# Patient Record
Sex: Male | Born: 1945
Health system: Southern US, Community
[De-identification: ages and names within clinical notes are randomized; demographics above are authoritative.]

## PROBLEM LIST (undated history)

## (undated) DIAGNOSIS — I10 Essential (primary) hypertension: Secondary | ICD-10-CM

## (undated) DIAGNOSIS — E119 Type 2 diabetes mellitus without complications: Secondary | ICD-10-CM

## (undated) DIAGNOSIS — C801 Malignant (primary) neoplasm, unspecified: Secondary | ICD-10-CM

## (undated) DIAGNOSIS — M549 Dorsalgia, unspecified: Secondary | ICD-10-CM

## (undated) HISTORY — PX: BACK SURGERY: SHX140

## (undated) HISTORY — PX: PROSTATE SURGERY: SHX751

## (undated) HISTORY — DX: Malignant (primary) neoplasm, unspecified: C80.1

---

## 1997-08-29 DIAGNOSIS — C801 Malignant (primary) neoplasm, unspecified: Secondary | ICD-10-CM

## 1997-08-29 HISTORY — DX: Malignant (primary) neoplasm, unspecified: C80.1

## 2015-07-13 ENCOUNTER — Encounter: Payer: Self-pay | Admitting: Sports Medicine

## 2015-07-14 ENCOUNTER — Encounter: Payer: Self-pay | Admitting: Sports Medicine

## 2015-07-14 ENCOUNTER — Ambulatory Visit (INDEPENDENT_AMBULATORY_CARE_PROVIDER_SITE_OTHER): Payer: PPO

## 2015-07-14 ENCOUNTER — Ambulatory Visit (INDEPENDENT_AMBULATORY_CARE_PROVIDER_SITE_OTHER): Payer: PPO | Admitting: Sports Medicine

## 2015-07-14 VITALS — BP 169/91 | HR 72 | Temp 98.3°F | Resp 18 | Wt 198.5 lb

## 2015-07-14 DIAGNOSIS — M1711 Unilateral primary osteoarthritis, right knee: Secondary | ICD-10-CM

## 2015-07-14 DIAGNOSIS — M1712 Unilateral primary osteoarthritis, left knee: Secondary | ICD-10-CM | POA: Diagnosis not present

## 2015-07-14 DIAGNOSIS — M7061 Trochanteric bursitis, right hip: Secondary | ICD-10-CM | POA: Diagnosis not present

## 2015-07-14 DIAGNOSIS — M25551 Pain in right hip: Secondary | ICD-10-CM

## 2015-07-14 MED ORDER — MELOXICAM 15 MG PO TABS: ORAL_TABLET | ORAL | Status: DC

## 2015-07-14 NOTE — Assessment & Plan Note (Signed)
We will start conservatively. Formal physical therapy, meloxicam, return in one month, injection if no better.

## 2015-07-14 NOTE — Assessment & Plan Note (Signed)
X-rays, meloxicam, formal physical therapy, return in one month, injection if no better.

## 2015-07-14 NOTE — Progress Notes (Signed)
  Subjective:    CC hip and knee pain  HPI:  This is a pleasant 69 year old male, he comes in with a several month history of pain he localizes on the lateral aspect of his right hip, moderate, persistent without radiation, he also has pain he localizes occasionally at the medial joint line with clicking and catching. He feels very weak, and he does have some atrophy in the leg. He does have a history of prostate cancer post-radical prostatectomy, without any known evidence of metastatic spread. He denies any back pain, no paresthesias, no bowel or bladder dysfunction.  Past medical history, Surgical history, Family history not pertinant except as noted below, Social history, Allergies, and medications have been entered into the medical record, reviewed, and no changes needed.   Review of Systems: No headache, visual changes, nausea, vomiting, diarrhea, constipation, dizziness, abdominal pain, skin rash, fevers, chills, night sweats, swollen lymph nodes, weight loss, chest pain, body aches, joint swelling, muscle aches, shortness of breath, mood changes, visual or auditory hallucinations.  Objective:    General: Well Developed, well nourished, and in no acute distress.  Neuro: Alert and oriented x3, extra-ocular muscles intact, sensation grossly intact.  HEENT: Normocephalic, atraumatic, pupils equal round reactive to light, neck supple, no masses, no lymphadenopathy, thyroid nonpalpable.  Skin: Warm and dry, no rashes noted.  Cardiac: Regular rate and rhythm, no murmurs rubs or gallops.  Respiratory: Clear to auscultation bilaterally. Not using accessory muscles, speaking in full sentences.  Abdominal: Soft, nontender, nondistended, positive bowel sounds, no masses, no organomegaly.  Right Hip: ROM IR: 60 Deg, ER: 60 Deg, Flexion: 120 Deg, Extension: 100 Deg, Abduction: 45 Deg, Adduction: 45 Deg Strength IR: 5/5, ER: 5/5, Flexion: 5/5, Extension: 5/5, Abduction: 5/5, Adduction: 5/5 Pelvic  alignment unremarkable to inspection and palpation. Standing hip rotation and gait without trendelenburg / unsteadiness. Greater trochanter with severe tenderness to palpation with extremely weak hip abductor's on the right. No tenderness over piriformis. No SI joint tenderness and normal minimal SI movement. Right Knee: Only minimal visible swelling with mild tenderness at the medial joint line. ROM normal in flexion and extension and lower leg rotation. Ligaments with solid consistent endpoints including ACL, PCL, LCL, MCL. Negative Mcmurray's and provocative meniscal tests. Non painful patellar compression. Patellar and quadriceps tendons unremarkable. Hamstring and quadriceps strength is normal.  Impression and Recommendations:    The patient was counselled, risk factors were discussed, anticipatory guidance given.

## 2015-07-27 ENCOUNTER — Encounter: Payer: Self-pay | Admitting: Rehabilitative and Restorative Service Providers"

## 2015-07-27 ENCOUNTER — Ambulatory Visit (INDEPENDENT_AMBULATORY_CARE_PROVIDER_SITE_OTHER): Payer: PPO | Admitting: Rehabilitative and Restorative Service Providers"

## 2015-07-27 DIAGNOSIS — R269 Unspecified abnormalities of gait and mobility: Secondary | ICD-10-CM

## 2015-07-27 DIAGNOSIS — R6889 Other general symptoms and signs: Secondary | ICD-10-CM

## 2015-07-27 DIAGNOSIS — M623 Immobility syndrome (paraplegic): Secondary | ICD-10-CM | POA: Diagnosis not present

## 2015-07-27 DIAGNOSIS — Z7409 Other reduced mobility: Secondary | ICD-10-CM

## 2015-07-27 DIAGNOSIS — M2560 Stiffness of unspecified joint, not elsewhere classified: Secondary | ICD-10-CM

## 2015-07-27 DIAGNOSIS — R531 Weakness: Secondary | ICD-10-CM

## 2015-07-27 DIAGNOSIS — R29898 Other symptoms and signs involving the musculoskeletal system: Secondary | ICD-10-CM

## 2015-07-27 NOTE — Therapy (Signed)
[place], [place], [address] Phone: [phone]   Fax:  [phone]  Physical Therapy Evaluation  Patient Details  Name: Anthony Peterson MRN: [id number] Date of Birth: Jan 05, 1946 Referring Provider: Dr. Dianah Field   Encounter Date: 07/27/2015      PT End of Session - 07/27/15 0928    Visit Number 1   Number of Visits 12   Date for PT Re-Evaluation 09/07/15   PT Start Time 0929   PT Stop Time 1015   PT Time Calculation (min) 46 min   Activity Tolerance Patient tolerated treatment well      Past Medical History  Diagnosis Date  . Cancer [facility]) 1999    Prostate    Past Surgical History  Procedure Laterality Date  . Prostate surgery      There were no vitals filed for this visit.  Visit Diagnosis:  Abnormal gait - Plan: PT plan of care cert/re-cert  Stiffness due to immobility - Plan: PT plan of care cert/re-cert  Weakness of right leg - Plan: PT plan of care cert/re-cert  Decreased strength, endurance, and mobility - Plan: PT plan of care cert/re-cert      Subjective Assessment - 07/27/15 0931    Subjective Patient reports that he has difficulty with Rt hip - some pain and feeling of dragging when he does much walking. He has had symptoms for several years. He had several tests but no treatment. MD has injection scheduled 08/07/15.    Pertinent History Rt hip problems fot ~4 years ago inc weakness and difficulty walking with knee not bending and leg "grabbing" some difficulty going up stairs    How long can you sit comfortably? 2 hours - difficulty moving after having been sitting    How long can you stand comfortably? 1 hour with movement    How long can you walk comfortably? 20-30 min    Diagnostic tests xrays hip and knee             [facility] PT Assessment - 07/27/15 0001    Assessment   Medical Diagnosis trochanteric bursitis Rt hip    Referring Provider Dr. Dianah Field     Onset Date/Surgical Date 09/09/14   Hand Dominance Right   Next MD Visit 08/11/15   Prior Therapy none   Precautions   Precautions None   Balance Screen   Has the patient fallen in the past 6 months No   Has the patient had a decrease in activity level because of a fear of falling?  No   Is the patient reluctant to leave their home because of a fear of falling?  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 - no lifting    Leisure yard work seasonally; TV   Observation/Other Assessments   Focus on Therapeutic Outcomes (FOTO)  60% limitation   Sensation   Additional Comments WFL's per pt report    Posture/Postural Control   Posture Comments som ehead forward posture with increased thoracic kyphosis; decreased lumbar lordosis - weight shifted to Lt with Rt hips and knee slightly bent   AROM   Lumbar Flexion 55%   Lumbar Extension 45%   Lumbar - Right Side Bend 65%   Lumbar - Left  Side Bend 70%   Lumbar - Right Rotation 60%   Lumbar - Left Rotation 65%   Strength   Right Hip Flexion 4/5   Right Hip Extension 4/5   Right Hip ABduction 4+/5   Right Hip ADduction 4+/5   Left Hip Flexion 5/5   Left Hip Extension 5/5   Left Hip ABduction 5/5   Left Hip ADduction 5/5   Right/Left Knee --  5/5 bilat    Right/Left Ankle --  heel lift x10 Lt/Rt inc UE support Rt Lt 4+/5; Rt 4/5    Flexibility   Hamstrings Lt 75 deg ; Rt 61 deg    Quadriceps heel ~ 10 inches from buttock Rt; 8 Lt    ITB tight Rt > Lt   Piriformis tight Rt > Lt    Palpation   Palpation comment min/no tenderness to palpation Rt hip area    Ambulation/Gait   Gait Comments abnormal gait with stiffnes sRt LE; circumducts Rt LE with swing phase; decreased wt bearing phase Rt; shuffling typr gait  without fully lifting Rt LE in swing phase                    [facility] Adult PT Treatment/Exercise - 07/27/15 0001    Knee/Hip Exercises: Stretches   Passive Hamstring Stretch 2 reps;30 seconds   Quad Stretch 2 reps;30 seconds   ITB Stretch 2 reps;30 seconds   Piriformis Stretch 2 reps;30 seconds   Other Knee/Hip Stretches prone press up 2 sec hold x10                 PT Education - 07/27/15 1006    Education provided Yes   Education Details HEP   Person(s) Educated Patient   Methods Explanation;Demonstration;Tactile cues;Verbal cues;Handout   Comprehension Verbalized understanding;Returned demonstration;Verbal cues required;Tactile cues required             PT Long Term Goals - 07/27/15 1333    PT LONG TERM GOAL #1   Title Improve hip/LE mobility by 5-10 deg in hamstring stretch and 2-3 inches quad stretch 09/07/15   Time 6   Period Weeks   Status New   PT LONG TERM GOAL #2   Title Increase strength Rt LE by 1/2 to 1 muscle grade 09/07/15   Time 6   Period Weeks   Status New   PT LONG TERM GOAL #3   Title Improve gait pattern with more equal wt bearing Rt LE and improved swing phase 09/07/15   Time 6   Period Weeks   Status New   PT LONG TERM GOAL #4   Title Improve walking tolerance to 45 min 09/07/15   Time 6   Period Weeks   Status New   PT LONG TERM GOAL #5   Title Imrprove FOTO to </= 44% limitation 09/07/15   Time 6   Period Weeks   Status New               Plan - 07/27/15 1330    Clinical Impression Statement Anthony Peterson presents with Rt hip dysfunction including abnormal gait pattern; limited hip/UE mobility and ROM; Rt LE weakness; intermittent pain. He will benefit from PT to address problems identified.    Pt will benefit from skilled therapeutic intervention in order to improve on the following deficits Postural dysfunction;Abnormal gait;Decreased range of motion;Decreased mobility;Decreased strength;Decreased endurance;Decreased activity  tolerance;Pain   Rehab Potential Good   PT Frequency 2x / week   PT Duration 6  weeks   PT Treatment/Interventions Patient/family education;ADLs/Self Care Home Management;Neuromuscular re-education;Manual techniques;Dry needling;Therapeutic exercise;Therapeutic activities;Moist Heat;Electrical Stimulation;Cryotherapy;Ultrasound   PT Next Visit Plan progress with stretching and add strengthening exercises; gait training   PT Home Exercise Plan HEP   Consulted and Agree with Plan of Care Patient         Problem List Patient Active Problem List   Diagnosis Date Noted  . Trochanteric bursitis of right hip 07/14/2015  . Primary osteoarthritis of right knee 07/14/2015    Terralyn Matsumura Nilda Simmer PT, MPH 07/27/2015, 1:40 PM  [facility] [place], [place], [address] Phone: [phone]   Fax:  [phone]  Name: Anthony Peterson MRN: [id number] Date of Birth: 1945-10-23

## 2015-07-27 NOTE — Patient Instructions (Signed)
Trunk: Prone Extension (Press-Ups)    Lie on stomach on firm, flat surface. Relax bottom and legs. Raise chest in air with elbows straight. Keep hips flat on surface, sag stomach. Hold __2__ seconds. Repeat _10___ times. Do __2__ sessions per day. CAUTION: Movement should be gentle and slow.    Quads / HF, Prone    Lie face down, knees together. Grasp one ankle with same-side hand. Use belt  if needed to reach. Gently pull foot toward buttock. Hold _30__ seconds. Repeat _3__ times per session. Do __2-3_ sessions per day.  HIP: Hamstrings - Supine    Place strap around foot. Raise leg up, keep knee straight. Hold __30_ seconds. __3_ reps per set 2-3 per day   Outer Hip Stretch: Reclined IT Band Stretch (Strap)    Strap around opposite foot, pull across only as far as possible with shoulders on mat. Hold for __30 sec. Repeat __3__ times each leg. 2 times/day   Knee to Chest (Flexion)    Pull knee toward chest. Feel stretch in lower back or buttock area. Breathing deeply, Hold _39___ seconds. Repeat with other knee. Repeat __3__ times. Do _2___ sessions per day.   Piriformis Stretch    Lying on back, pull right knee toward opposite shoulder. Hold _30___ seconds. Repeat __3__ times. Do __2-3__ sessions per day.

## 2015-07-30 ENCOUNTER — Encounter: Payer: Self-pay | Admitting: Rehabilitative and Restorative Service Providers"

## 2015-07-30 ENCOUNTER — Ambulatory Visit (INDEPENDENT_AMBULATORY_CARE_PROVIDER_SITE_OTHER): Payer: PPO | Admitting: Rehabilitative and Restorative Service Providers"

## 2015-07-30 DIAGNOSIS — M623 Immobility syndrome (paraplegic): Secondary | ICD-10-CM

## 2015-07-30 DIAGNOSIS — R269 Unspecified abnormalities of gait and mobility: Secondary | ICD-10-CM

## 2015-07-30 DIAGNOSIS — M2560 Stiffness of unspecified joint, not elsewhere classified: Secondary | ICD-10-CM

## 2015-07-30 DIAGNOSIS — R531 Weakness: Secondary | ICD-10-CM

## 2015-07-30 DIAGNOSIS — R29898 Other symptoms and signs involving the musculoskeletal system: Secondary | ICD-10-CM | POA: Diagnosis not present

## 2015-07-30 DIAGNOSIS — Z7409 Other reduced mobility: Secondary | ICD-10-CM | POA: Diagnosis not present

## 2015-07-30 DIAGNOSIS — R6889 Other general symptoms and signs: Secondary | ICD-10-CM

## 2015-07-30 NOTE — Patient Instructions (Addendum)
Bridging    Slowly raise buttocks from floor, keeping stomach tight. Hold 10 sec  Repeat __10__ times per set.  Do _2-3___ sessions per day.   Strengthening: Wall Slide    Leaning on wall, slowly lower buttocks until thighs are parallel to floor. Hold _10___ seconds. Tighten thigh muscles and return. Repeat __10__ times per set.  Do _2___ sessions per day.   Double Knee to Chest (Flexion)    Gently pull both knees toward chest. Feel stretch in lower back or buttock area. Breathing deeply, Hold __30__ seconds. Repeat _3___ times. Do __2__ sessions per day.    Lumbar Rotation: Caudal - Bilateral (Supine)    Feet and knees together, arms outstretched, rotate knees left, turning head in opposite direction, until stretch is felt. Hold __5-10__ seconds. Relax. Repeat __5__ times per set. Do _2___ sessions per day.   Lying on stomach; tighten buttocks; hold 10 sec repeat 10 times 2 x day.

## 2015-07-30 NOTE — Therapy (Signed)
[place], [place], [address] Phone: [phone]   Fax:  [phone]  Physical Therapy Treatment  Patient Details  Name: Anthony Peterson MRN: [id number] Date of Birth: [birth year] Referring Provider: Dr. Dianah Field   Encounter Date: 07/30/2015      PT End of Session - 07/30/15 0851    Visit Number 2   Number of Visits 12   Date for PT Re-Evaluation 09/07/15   PT Start Time 0849   PT Stop Time 0930   PT Time Calculation (min) 41 min   Activity Tolerance Patient tolerated treatment well      Past Medical History  Diagnosis Date  . Cancer [facility]) 1999    Prostate    Past Surgical History  Procedure Laterality Date  . Prostate surgery      There were no vitals filed for this visit.  Visit Diagnosis:  Abnormal gait  Stiffness due to immobility  Weakness of right leg  Decreased strength, endurance, and mobility      Subjective Assessment - 07/30/15 0852    Subjective Has done his exercises. Does not have pain just stiffness and difficulty walking.    Currently in Pain? No/denies                         [facility] Adult PT Treatment/Exercise - 07/30/15 0001    Knee/Hip Exercises: Stretches   Passive Hamstring Stretch 2 reps;30 seconds   Quad Stretch 2 reps;30 seconds   ITB Stretch 2 reps;30 seconds   Piriformis Stretch 2 reps;30 seconds   Other Knee/Hip Stretches prone press up 2 sec hold x10    Other Knee/Hip Stretches trunk rotation each side x 5    Knee/Hip Exercises: Standing   Wall Squat 5 reps  20 sec hold    Other Standing Knee Exercises working on standing posture and alignment with equal weight Rt LE    Knee/Hip Exercises: Supine   Bridges Limitations 10 sec x 5    Other Supine Knee/Hip Exercises bilat knee to chest 30 sec x 2                 PT Education - 07/30/15 0903    Education provided Yes   Education Details HEP   Person(s) Educated  Patient   Methods Explanation;Demonstration;Tactile cues;Verbal cues;Handout   Comprehension Verbalized understanding;Returned demonstration;Verbal cues required;Tactile cues required             PT Long Term Goals - 07/27/15 1333    PT LONG TERM GOAL #1   Title Improve hip/LE mobility by 5-10 deg in hamstring stretch and 2-3 inches quad stretch 09/07/15   Time 6   Period Weeks   Status New   PT LONG TERM GOAL #2   Title Increase strength Rt LE by 1/2 to 1 muscle grade 09/07/15   Time 6   Period Weeks   Status New   PT LONG TERM GOAL #3   Title Improve gait pattern with more equal wt bearing Rt LE and improved swing phase 09/07/15   Time 6   Period Weeks   Status New   PT LONG TERM GOAL #4   Title Improve walking tolerance to 45 min 09/07/15   Time 6   Period Weeks   Status New   PT LONG TERM GOAL #5   Title Imrprove FOTO to </= 44% limitation 09/07/15   Time 6   Period Weeks  Status New               Plan - 07/30/15 0904    Clinical Impression Statement Tolerated exrercise well. he has significant tightness in bilat LE's Rt > Lt; abnormal gait pattern. No goals accomplished to date. Second visit.    Pt will benefit from skilled therapeutic intervention in order to improve on the following deficits Postural dysfunction;Abnormal gait;Decreased range of motion;Decreased mobility;Decreased strength;Decreased endurance;Decreased activity tolerance;Pain   PT Frequency 2x / week   PT Duration 6 weeks   PT Treatment/Interventions Patient/family education;ADLs/Self Care Home Management;Neuromuscular re-education;Manual techniques;Dry needling;Therapeutic exercise;Therapeutic activities;Moist Heat;Electrical Stimulation;Cryotherapy;Ultrasound   PT Next Visit Plan progress with stretching and add strengthening exercises; gait training   PT Home Exercise Plan HEP   Consulted and Agree with Plan of Care Patient        Problem List Patient Active Problem List   Diagnosis Date  Noted  . Trochanteric bursitis of right hip 07/14/2015  . Primary osteoarthritis of right knee 07/14/2015    Reianna Batdorf Nilda Simmer PT, MPH 07/30/2015, 9:33 AM  [facility] [place], [place], [address] Phone: [phone]   Fax:  [phone]  Name: Anthony Peterson MRN: [id number] Date of Birth: Mar 04, 1946

## 2015-08-03 ENCOUNTER — Ambulatory Visit (INDEPENDENT_AMBULATORY_CARE_PROVIDER_SITE_OTHER): Payer: PPO | Admitting: Rehabilitative and Restorative Service Providers"

## 2015-08-03 ENCOUNTER — Encounter: Payer: Self-pay | Admitting: Rehabilitative and Restorative Service Providers"

## 2015-08-03 DIAGNOSIS — R6889 Other general symptoms and signs: Secondary | ICD-10-CM

## 2015-08-03 DIAGNOSIS — R269 Unspecified abnormalities of gait and mobility: Secondary | ICD-10-CM

## 2015-08-03 DIAGNOSIS — Z7409 Other reduced mobility: Secondary | ICD-10-CM

## 2015-08-03 DIAGNOSIS — R29898 Other symptoms and signs involving the musculoskeletal system: Secondary | ICD-10-CM

## 2015-08-03 DIAGNOSIS — M623 Immobility syndrome (paraplegic): Secondary | ICD-10-CM

## 2015-08-03 DIAGNOSIS — R531 Weakness: Secondary | ICD-10-CM

## 2015-08-03 DIAGNOSIS — M2560 Stiffness of unspecified joint, not elsewhere classified: Secondary | ICD-10-CM

## 2015-08-03 NOTE — Therapy (Addendum)
[place], [place], [address] Phone: [phone]   Fax:  [phone]  Physical Therapy Treatment  Patient Details  Name: Anthony Peterson MRN: [id number] Date of Birth: 21-May-1946 Referring Provider: Dr. Dianah Field   Encounter Date: 08/03/2015      PT End of Session - 08/03/15 0845    Visit Number 3   Number of Visits 12   Date for PT Re-Evaluation 09/07/15   PT Start Time 0845   PT Stop Time 0929   PT Time Calculation (min) 44 min      Past Medical History  Diagnosis Date  . Cancer [facility] 1999    Prostate    Past Surgical History  Procedure Laterality Date  . Prostate surgery      There were no vitals filed for this visit.  Visit Diagnosis:  Abnormal gait  Stiffness due to immobility  Weakness of right leg  Decreased strength, endurance, and mobility      Subjective Assessment - 08/03/15 0847    Subjective pt reports that he is doing his exercises at home. His wofe got him a big belt to use for exercises. he can tell he is loosening up and that helps his walking    Currently in Pain? No/denies                         [facility] Adult PT Treatment/Exercise - 08/03/15 0001    Knee/Hip Exercises: Stretches   Passive Hamstring Stretch 2 reps;30 seconds   Quad Stretch 2 reps;30 seconds   ITB Stretch 2 reps;30 seconds   Piriformis Stretch 2 reps;30 seconds   Other Knee/Hip Stretches prone press up 2 sec hold x10    Other Knee/Hip Stretches trunk rotation each side x 5    Knee/Hip Exercises: Aerobic   Stationary Bike Nustep L4 x5    Knee/Hip Exercises: Standing   Lateral Step Up 20 reps;Hand Hold: 1   Wall Squat 5 reps  20 sec hold    SLS at counter 20-30 sec 0ne hand touch to no assist as needed 3 trials each LE    Other Standing Knee Exercises working on standing posture and alignment with equal weight Rt LE    Other Standing Knee Exercises reaching w/each  hand while in standing    Knee/Hip Exercises: Supine   Bridges Limitations 10 sec x 5    Other Supine Knee/Hip Exercises bilat knee to chest 30 sec x 2    Knee/Hip Exercises: Sidelying   Clams 10 x 2 bilat    Knee/Hip Exercises: Prone   Hip Extension Limitations toe touching table pt lifting knee off surface - alt 20 x ea                 PT Education - 08/03/15 0908    Education provided Yes   Education Details HEP   Person(s) Educated Patient   Methods Explanation;Demonstration;Tactile cues;Verbal cues;Handout   Comprehension Verbalized understanding;Returned demonstration;Verbal cues required;Tactile cues required             PT Long Term Goals - 08/03/15 0852    PT LONG TERM GOAL #1   Title Improve hip/LE mobility by 5-10 deg in hamstring stretch and 2-3 inches quad stretch 09/07/15   Time 6   Period Weeks   Status On-going   PT LONG TERM GOAL #2   Title Increase strength Rt LE by 1/2 to 1 muscle grade 09/07/15  Time 6   Period Weeks   Status On-going   PT LONG TERM GOAL #3   Title Improve gait pattern with more equal wt bearing Rt LE and improved swing phase 09/07/15   Time 6   Period Weeks   Status On-going   PT LONG TERM GOAL #4   Title Improve walking tolerance to 45 min 09/07/15   Time 6   Period Weeks   Status On-going   PT LONG TERM GOAL #5   Title Imrprove FOTO to </= 44% limitation 09/07/15   Time 6   Period Weeks   Status On-going               Plan - 08/03/15 0849    Clinical Impression Statement Progressing with flexibility; tolerates exercises well. Cont tightness bilat LE's Rt > Lt; abnormal gait pattern. Gradually progressing toward stated goals.    Pt will benefit from skilled therapeutic intervention in order to improve on the following deficits Postural dysfunction;Abnormal gait;Decreased range of motion;Decreased mobility;Decreased strength;Decreased endurance;Decreased activity tolerance;Pain   Rehab Potential Good   PT Frequency  2x / week   PT Duration 6 weeks   PT Treatment/Interventions Patient/family education;ADLs/Self Care Home Management;Neuromuscular re-education;Manual techniques;Dry needling;Therapeutic exercise;Therapeutic activities;Moist Heat;Electrical Stimulation;Cryotherapy;Ultrasound   PT Next Visit Plan progress with stretching and add strengthening exercises; gait training   PT Home Exercise Plan HEP   Consulted and Agree with Plan of Care Patient        Problem List Patient Active Problem List   Diagnosis Date Noted  . Trochanteric bursitis of right hip 07/14/2015  . Primary osteoarthritis of right knee 07/14/2015    Beaux Wedemeyer Nilda Simmer PT, MPH  08/03/2015, 9:29 AM  [facility] [place], [place], [address] Phone: [phone]   Fax:  [phone]  Name: Anthony Peterson MRN: [id number] Date of Birth: 02/05/1946

## 2015-08-03 NOTE — Patient Instructions (Signed)
Abduction: Clam - Side-Lying    Lie on side with knees bent. Lift top knee, keeping feet together. Keep trunk steady. Slowly lower for 3-5 seconds. __10_ reps per set, __2_ sets per day  FUNCTIONAL MOBILITY: Lateral Step Up    Step up sideways, leading with right leg. Then repeat with the Left leg  _10__ reps per set, _1-2__ sets per day  SINGLE LIMB STANCE    Stance: single leg on floor. Raise leg. Hold __20-30_ seconds. Repeat with other leg. _3__ reps per set, _2-3__ sets per day

## 2015-08-06 ENCOUNTER — Ambulatory Visit (INDEPENDENT_AMBULATORY_CARE_PROVIDER_SITE_OTHER): Payer: PPO | Admitting: Rehabilitative and Restorative Service Providers"

## 2015-08-06 ENCOUNTER — Encounter: Payer: Self-pay | Admitting: Rehabilitative and Restorative Service Providers"

## 2015-08-06 DIAGNOSIS — R269 Unspecified abnormalities of gait and mobility: Secondary | ICD-10-CM

## 2015-08-06 DIAGNOSIS — M623 Immobility syndrome (paraplegic): Secondary | ICD-10-CM

## 2015-08-06 DIAGNOSIS — R531 Weakness: Secondary | ICD-10-CM

## 2015-08-06 DIAGNOSIS — Z7409 Other reduced mobility: Secondary | ICD-10-CM

## 2015-08-06 DIAGNOSIS — R29898 Other symptoms and signs involving the musculoskeletal system: Secondary | ICD-10-CM | POA: Diagnosis not present

## 2015-08-06 DIAGNOSIS — M2560 Stiffness of unspecified joint, not elsewhere classified: Secondary | ICD-10-CM

## 2015-08-06 NOTE — Therapy (Addendum)
[place], [place], [address] Phone: [phone]   Fax:  [phone]  Physical Therapy Treatment  Patient Details  Name: Anthony Peterson MRN: [id number] Date of Birth: Jan 18, 1946 Referring Provider: Dr. Dianah Field   Encounter Date: 08/06/2015      PT End of Session - 08/06/15 0846    Visit Number 4   Number of Visits 12   Date for PT Re-Evaluation 09/07/15   PT Start Time 0846   PT Stop Time 0932   PT Time Calculation (min) 46 min   Activity Tolerance Patient tolerated treatment well      Past Medical History  Diagnosis Date  . Cancer [facility]) 1999    Prostate    Past Surgical History  Procedure Laterality Date  . Prostate surgery      There were no vitals filed for this visit.  Visit Diagnosis:  Abnormal gait  Stiffness due to immobility  Weakness of right leg  Decreased strength, endurance, and mobility      Subjective Assessment - 08/06/15 0908    Subjective Concentrating more on walking.    Currently in Pain? No/denies                         [facility] Adult PT Treatment/Exercise - 08/06/15 0001    Knee/Hip Exercises: Stretches   Passive Hamstring Stretch 2 reps;30 seconds   Quad Stretch 2 reps;30 seconds   ITB Stretch 2 reps;30 seconds   Piriformis Stretch 2 reps;30 seconds   Other Knee/Hip Stretches prone press up 2 sec hold x10    Other Knee/Hip Stretches trunk rotation each side x 5    Knee/Hip Exercises: Aerobic   Stationary Bike Nustep L4 x5    Knee/Hip Exercises: Standing   Lateral Step Up 20 reps;Hand Hold: 1;Right   Wall Squat 10 reps;10 seconds  20 sec hold    SLS at counter 20-30 sec 0ne hand touch to no assist as needed 3 trials each LE    Other Standing Knee Exercises working on standing posture and alignment with equal weight Rt LE    Knee/Hip Exercises: Seated   Sit to Sand 2 sets;10 reps;without UE support  one set with Rt LE  slightly behind Lt to inc wt bearing Rt    Knee/Hip Exercises: Supine   Bridges Limitations 10 sec x 5    Other Supine Knee/Hip Exercises bilat knee to chest 30 sec x 2    Knee/Hip Exercises: Sidelying   Clams 10 x 2 bilat green TB    Knee/Hip Exercises: Prone   Hip Extension Limitations toe touching table pt lifting knee off surface - alt 20 x ea    Straight Leg Raises Right;10 reps;2 sets                PT Education - 08/06/15 0908    Education provided Yes   Education Details HEP issued TB for home    Person(s) Educated Patient   Methods Explanation;Demonstration;Tactile cues;Verbal cues;Handout   Comprehension Verbalized understanding;Returned demonstration;Verbal cues required;Tactile cues required             PT Long Term Goals - 08/03/15 0852    PT LONG TERM GOAL #1   Title Improve hip/LE mobility by 5-10 deg in hamstring stretch and 2-3 inches quad stretch 09/07/15   Time 6   Period Weeks   Status On-going   PT LONG TERM GOAL #2   Title  Increase strength Rt LE by 1/2 to 1 muscle grade 09/07/15   Time 6   Period Weeks   Status On-going   PT LONG TERM GOAL #3   Title Improve gait pattern with more equal wt bearing Rt LE and improved swing phase 09/07/15   Time 6   Period Weeks   Status On-going   PT LONG TERM GOAL #4   Title Improve walking tolerance to 45 min 09/07/15   Time 6   Period Weeks   Status On-going   PT LONG TERM GOAL #5   Title Imrprove FOTO to </= 44% limitation 09/07/15   Time 6   Period Weeks   Status On-going               Plan - 08/06/15 0909    Clinical Impression Statement Cont tightness Rt > Lt. Responding well to treatment. Progressing toward goals of treatment    Pt will benefit from skilled therapeutic intervention in order to improve on the following deficits Postural dysfunction;Abnormal gait;Decreased range of motion;Decreased mobility;Decreased strength;Decreased endurance;Decreased activity tolerance;Pain   Rehab  Potential Good   PT Frequency 2x / week   PT Duration 6 weeks   PT Treatment/Interventions Patient/family education;ADLs/Self Care Home Management;Neuromuscular re-education;Manual techniques;Dry needling;Therapeutic exercise;Therapeutic activities;Moist Heat;Electrical Stimulation;Cryotherapy;Ultrasound   PT Home Exercise Plan HEP   Consulted and Agree with Plan of Care Patient        Problem List Patient Active Problem List   Diagnosis Date Noted  . Trochanteric bursitis of right hip 07/14/2015  . Primary osteoarthritis of right knee 07/14/2015    Angelik Walls Nilda Simmer PT, MPH  08/06/2015, 9:28 AM  [facility] [place], [place], [address] Phone: [phone]   Fax:  [phone]  Name: FAISAL STRADLING MRN: [id number] Date of Birth: April 28, 1946    PHYSICAL THERAPY DISCHARGE SUMMARY  Visits from Start of Care: 4  Current functional level related to goals / functional outcomes: Improving - decided to have injection and did not want to continue PT at this time.   Remaining deficits: Unknown   Education / Equipment: HEP  Plan: Patient agrees to discharge.  Patient goals were partially met. Patient is being discharged due to not returning since the last visit.  ?????     Ganon Demasi P. Helene Kelp PT, MPH 09/04/2015 8:24 AM

## 2015-08-06 NOTE — Patient Instructions (Signed)
Functional Quadriceps: Sit to Stand    Sit on edge of chair, feet flat on floor. Slowly stand upright, extending knees fully.  Sit back to chair slowly without plopping down.  Repeat _10___ times per set. Do _1-2___ sets per session. Do __2-3__ sessions per day.

## 2015-08-11 ENCOUNTER — Ambulatory Visit (INDEPENDENT_AMBULATORY_CARE_PROVIDER_SITE_OTHER): Payer: PPO | Admitting: Sports Medicine

## 2015-08-11 VITALS — BP 156/85 | HR 79 | Temp 97.7°F | Resp 18 | Wt 197.8 lb

## 2015-08-11 DIAGNOSIS — M1711 Unilateral primary osteoarthritis, right knee: Secondary | ICD-10-CM | POA: Diagnosis not present

## 2015-08-11 DIAGNOSIS — M7061 Trochanteric bursitis, right hip: Secondary | ICD-10-CM | POA: Diagnosis not present

## 2015-08-11 NOTE — Assessment & Plan Note (Signed)
Improve but insufficiently so with physical therapy and meloxicam, greater trochanteric bursa injection as above. Return in one month.

## 2015-08-11 NOTE — Assessment & Plan Note (Signed)
Doing well with physical therapy and meloxicam.

## 2015-08-11 NOTE — Progress Notes (Signed)
  Subjective:    CC: Follow-up  HPI: Right knee osteoarthritis: Resolved with physical therapy and meloxicam  Right trochanteric bursitis: Improved significantly but persistent pain despite physical therapy and meloxicam, agreeable to try injection. Pain is moderate, persistent without radiation and localized over the greater trochanter.  Past medical history, Surgical history, Family history not pertinant except as noted below, Social history, Allergies, and medications have been entered into the medical record, reviewed, and no changes needed.   Review of Systems: No fevers, chills, night sweats, weight loss, chest pain, or shortness of breath.   Objective:    General: Well Developed, well nourished, and in no acute distress.  Neuro: Alert and oriented x3, extra-ocular muscles intact, sensation grossly intact.  HEENT: Normocephalic, atraumatic, pupils equal round reactive to light, neck supple, no masses, no lymphadenopathy, thyroid nonpalpable.  Skin: Warm and dry, no rashes. Cardiac: Regular rate and rhythm, no murmurs rubs or gallops, no lower extremity edema.  Respiratory: Clear to auscultation bilaterally. Not using accessory muscles, speaking in full sentences. Right Hip: ROM IR: 60 Deg, ER: 60 Deg, Flexion: 120 Deg, Extension: 100 Deg, Abduction: 45 Deg, Adduction: 45 Deg Strength IR: 5/5, ER: 5/5, Flexion: 5/5, Extension: 5/5, Abduction: 5/5, Adduction: 5/5 Pelvic alignment unremarkable to inspection and palpation. Standing hip rotation and gait without trendelenburg / unsteadiness. Greater trochanter with mild to moderate tenderness to palpation. No tenderness over piriformis. No SI joint tenderness and normal minimal SI movement.  Procedure:  Injection of right greater trochanteric bursa Consent obtained and verified. Time-out conducted. Noted no overlying erythema, induration, or other signs of local infection. Skin prepped in a sterile fashion. Topical analgesic  spray: Ethyl chloride. Completed without difficulty. Meds: Spinal needle advanced to the greater trochanter, contacted bone and 1 mL kenalog 40, 2 mL lidocaine, 2 mL Marcaine injected in a fanlike pattern. Pain immediately improved suggesting accurate placement of the medication. Advised to call if fevers/chills, erythema, induration, drainage, or persistent bleeding.  Impression and Recommendations:

## 2015-09-11 ENCOUNTER — Ambulatory Visit (INDEPENDENT_AMBULATORY_CARE_PROVIDER_SITE_OTHER): Payer: PPO | Admitting: Sports Medicine

## 2015-09-11 VITALS — BP 178/88 | HR 75 | Temp 97.7°F | Resp 18 | Wt 199.1 lb

## 2015-09-11 DIAGNOSIS — M7061 Trochanteric bursitis, right hip: Secondary | ICD-10-CM | POA: Diagnosis not present

## 2015-09-11 DIAGNOSIS — M1711 Unilateral primary osteoarthritis, right knee: Secondary | ICD-10-CM

## 2015-09-11 NOTE — Progress Notes (Signed)
  Subjective:    CC: Follow-up  HPI:  Right trochanteric bursitis: Essentially resolved with rehabilitation exercises and NSAIDs.  Right knee osteoarthritis: Resolved with NSAIDs and rehabilitation exercises. He continues to get better day today and does not desire interventional treatment until the summertime.  Past medical history, Surgical history, Family history not pertinant except as noted below, Social history, Allergies, and medications have been entered into the medical record, reviewed, and no changes needed.   Review of Systems: No fevers, chills, night sweats, weight loss, chest pain, or shortness of breath.   Objective:    General: Well Developed, well nourished, and in no acute distress.  Neuro: Alert and oriented x3, extra-ocular muscles intact, sensation grossly intact.  HEENT: Normocephalic, atraumatic, pupils equal round reactive to light, neck supple, no masses, no lymphadenopathy, thyroid nonpalpable.  Skin: Warm and dry, no rashes. Cardiac: Regular rate and rhythm, no murmurs rubs or gallops, no lower extremity edema.  Respiratory: Clear to auscultation bilaterally. Not using accessory muscles, speaking in full sentences.  Impression and Recommendations:

## 2015-09-11 NOTE — Assessment & Plan Note (Signed)
Nearly resolved with conservative measures.

## 2015-09-11 NOTE — Assessment & Plan Note (Signed)
Resolved

## 2015-11-23 DIAGNOSIS — R202 Paresthesia of skin: Secondary | ICD-10-CM | POA: Diagnosis not present

## 2015-11-23 DIAGNOSIS — R05 Cough: Secondary | ICD-10-CM | POA: Diagnosis not present

## 2015-11-23 DIAGNOSIS — M50321 Other cervical disc degeneration at C4-C5 level: Secondary | ICD-10-CM | POA: Diagnosis not present

## 2015-11-24 DIAGNOSIS — R202 Paresthesia of skin: Secondary | ICD-10-CM | POA: Diagnosis not present

## 2015-11-24 DIAGNOSIS — R05 Cough: Secondary | ICD-10-CM | POA: Diagnosis not present

## 2015-12-09 DIAGNOSIS — M4802 Spinal stenosis, cervical region: Secondary | ICD-10-CM | POA: Diagnosis not present

## 2015-12-09 DIAGNOSIS — M542 Cervicalgia: Secondary | ICD-10-CM | POA: Diagnosis not present

## 2015-12-18 DIAGNOSIS — M4712 Other spondylosis with myelopathy, cervical region: Secondary | ICD-10-CM | POA: Diagnosis not present

## 2015-12-18 DIAGNOSIS — G959 Disease of spinal cord, unspecified: Secondary | ICD-10-CM | POA: Diagnosis not present

## 2015-12-18 DIAGNOSIS — G9520 Unspecified cord compression: Secondary | ICD-10-CM | POA: Diagnosis not present

## 2015-12-18 DIAGNOSIS — M5030 Other cervical disc degeneration, unspecified cervical region: Secondary | ICD-10-CM | POA: Diagnosis not present

## 2015-12-18 DIAGNOSIS — M542 Cervicalgia: Secondary | ICD-10-CM | POA: Diagnosis not present

## 2015-12-25 DIAGNOSIS — I10 Essential (primary) hypertension: Secondary | ICD-10-CM | POA: Diagnosis not present

## 2015-12-25 DIAGNOSIS — E119 Type 2 diabetes mellitus without complications: Secondary | ICD-10-CM | POA: Diagnosis not present

## 2015-12-25 DIAGNOSIS — M5010 Cervical disc disorder with radiculopathy, unspecified cervical region: Secondary | ICD-10-CM | POA: Diagnosis not present

## 2015-12-25 DIAGNOSIS — Z01812 Encounter for preprocedural laboratory examination: Secondary | ICD-10-CM | POA: Diagnosis not present

## 2015-12-30 DIAGNOSIS — E119 Type 2 diabetes mellitus without complications: Secondary | ICD-10-CM | POA: Diagnosis not present

## 2015-12-30 DIAGNOSIS — I10 Essential (primary) hypertension: Secondary | ICD-10-CM | POA: Diagnosis not present

## 2015-12-30 DIAGNOSIS — G9520 Unspecified cord compression: Secondary | ICD-10-CM | POA: Diagnosis not present

## 2015-12-30 DIAGNOSIS — M4712 Other spondylosis with myelopathy, cervical region: Secondary | ICD-10-CM | POA: Diagnosis not present

## 2015-12-30 DIAGNOSIS — M5000 Cervical disc disorder with myelopathy, unspecified cervical region: Secondary | ICD-10-CM | POA: Diagnosis not present

## 2015-12-30 DIAGNOSIS — M4802 Spinal stenosis, cervical region: Secondary | ICD-10-CM | POA: Diagnosis not present

## 2015-12-30 DIAGNOSIS — E785 Hyperlipidemia, unspecified: Secondary | ICD-10-CM | POA: Diagnosis not present

## 2015-12-30 DIAGNOSIS — M4322 Fusion of spine, cervical region: Secondary | ICD-10-CM | POA: Diagnosis not present

## 2015-12-30 DIAGNOSIS — I341 Nonrheumatic mitral (valve) prolapse: Secondary | ICD-10-CM | POA: Diagnosis not present

## 2015-12-31 DIAGNOSIS — M4802 Spinal stenosis, cervical region: Secondary | ICD-10-CM | POA: Diagnosis not present

## 2015-12-31 DIAGNOSIS — M50021 Cervical disc disorder at C4-C5 level with myelopathy: Secondary | ICD-10-CM | POA: Diagnosis not present

## 2015-12-31 DIAGNOSIS — M5136 Other intervertebral disc degeneration, lumbar region: Secondary | ICD-10-CM | POA: Diagnosis not present

## 2016-01-01 DIAGNOSIS — Z87891 Personal history of nicotine dependence: Secondary | ICD-10-CM | POA: Diagnosis not present

## 2016-01-01 DIAGNOSIS — I10 Essential (primary) hypertension: Secondary | ICD-10-CM | POA: Diagnosis not present

## 2016-01-01 DIAGNOSIS — R269 Unspecified abnormalities of gait and mobility: Secondary | ICD-10-CM | POA: Diagnosis not present

## 2016-01-01 DIAGNOSIS — Z4789 Encounter for other orthopedic aftercare: Secondary | ICD-10-CM | POA: Diagnosis not present

## 2016-01-01 DIAGNOSIS — R531 Weakness: Secondary | ICD-10-CM | POA: Diagnosis not present

## 2016-01-01 DIAGNOSIS — E119 Type 2 diabetes mellitus without complications: Secondary | ICD-10-CM | POA: Diagnosis not present

## 2016-01-05 DIAGNOSIS — I10 Essential (primary) hypertension: Secondary | ICD-10-CM | POA: Diagnosis not present

## 2016-01-05 DIAGNOSIS — E785 Hyperlipidemia, unspecified: Secondary | ICD-10-CM | POA: Diagnosis not present

## 2016-01-05 DIAGNOSIS — J300 Vasomotor rhinitis: Secondary | ICD-10-CM | POA: Diagnosis not present

## 2016-01-05 DIAGNOSIS — M4802 Spinal stenosis, cervical region: Secondary | ICD-10-CM | POA: Diagnosis not present

## 2016-01-05 DIAGNOSIS — R202 Paresthesia of skin: Secondary | ICD-10-CM | POA: Diagnosis not present

## 2016-01-06 DIAGNOSIS — I10 Essential (primary) hypertension: Secondary | ICD-10-CM | POA: Diagnosis not present

## 2016-01-06 DIAGNOSIS — E119 Type 2 diabetes mellitus without complications: Secondary | ICD-10-CM | POA: Diagnosis not present

## 2016-01-06 DIAGNOSIS — Z4789 Encounter for other orthopedic aftercare: Secondary | ICD-10-CM | POA: Diagnosis not present

## 2016-01-06 DIAGNOSIS — R531 Weakness: Secondary | ICD-10-CM | POA: Diagnosis not present

## 2016-01-06 DIAGNOSIS — Z87891 Personal history of nicotine dependence: Secondary | ICD-10-CM | POA: Diagnosis not present

## 2016-01-06 DIAGNOSIS — R269 Unspecified abnormalities of gait and mobility: Secondary | ICD-10-CM | POA: Diagnosis not present

## 2016-01-08 DIAGNOSIS — I10 Essential (primary) hypertension: Secondary | ICD-10-CM | POA: Diagnosis not present

## 2016-01-08 DIAGNOSIS — Z4789 Encounter for other orthopedic aftercare: Secondary | ICD-10-CM | POA: Diagnosis not present

## 2016-01-08 DIAGNOSIS — E119 Type 2 diabetes mellitus without complications: Secondary | ICD-10-CM | POA: Diagnosis not present

## 2016-01-08 DIAGNOSIS — Z87891 Personal history of nicotine dependence: Secondary | ICD-10-CM | POA: Diagnosis not present

## 2016-01-08 DIAGNOSIS — R531 Weakness: Secondary | ICD-10-CM | POA: Diagnosis not present

## 2016-01-08 DIAGNOSIS — R269 Unspecified abnormalities of gait and mobility: Secondary | ICD-10-CM | POA: Diagnosis not present

## 2016-01-12 DIAGNOSIS — I10 Essential (primary) hypertension: Secondary | ICD-10-CM | POA: Diagnosis not present

## 2016-01-12 DIAGNOSIS — Z87891 Personal history of nicotine dependence: Secondary | ICD-10-CM | POA: Diagnosis not present

## 2016-01-12 DIAGNOSIS — Z4789 Encounter for other orthopedic aftercare: Secondary | ICD-10-CM | POA: Diagnosis not present

## 2016-01-12 DIAGNOSIS — E119 Type 2 diabetes mellitus without complications: Secondary | ICD-10-CM | POA: Diagnosis not present

## 2016-01-12 DIAGNOSIS — R269 Unspecified abnormalities of gait and mobility: Secondary | ICD-10-CM | POA: Diagnosis not present

## 2016-01-12 DIAGNOSIS — R531 Weakness: Secondary | ICD-10-CM | POA: Diagnosis not present

## 2016-01-14 DIAGNOSIS — E119 Type 2 diabetes mellitus without complications: Secondary | ICD-10-CM | POA: Diagnosis not present

## 2016-01-14 DIAGNOSIS — R269 Unspecified abnormalities of gait and mobility: Secondary | ICD-10-CM | POA: Diagnosis not present

## 2016-01-14 DIAGNOSIS — I10 Essential (primary) hypertension: Secondary | ICD-10-CM | POA: Diagnosis not present

## 2016-01-14 DIAGNOSIS — Z87891 Personal history of nicotine dependence: Secondary | ICD-10-CM | POA: Diagnosis not present

## 2016-01-14 DIAGNOSIS — R531 Weakness: Secondary | ICD-10-CM | POA: Diagnosis not present

## 2016-01-14 DIAGNOSIS — Z4789 Encounter for other orthopedic aftercare: Secondary | ICD-10-CM | POA: Diagnosis not present

## 2016-01-19 DIAGNOSIS — R531 Weakness: Secondary | ICD-10-CM | POA: Diagnosis not present

## 2016-01-19 DIAGNOSIS — R269 Unspecified abnormalities of gait and mobility: Secondary | ICD-10-CM | POA: Diagnosis not present

## 2016-01-19 DIAGNOSIS — E119 Type 2 diabetes mellitus without complications: Secondary | ICD-10-CM | POA: Diagnosis not present

## 2016-01-19 DIAGNOSIS — I10 Essential (primary) hypertension: Secondary | ICD-10-CM | POA: Diagnosis not present

## 2016-01-19 DIAGNOSIS — Z87891 Personal history of nicotine dependence: Secondary | ICD-10-CM | POA: Diagnosis not present

## 2016-01-19 DIAGNOSIS — Z4789 Encounter for other orthopedic aftercare: Secondary | ICD-10-CM | POA: Diagnosis not present

## 2016-01-21 DIAGNOSIS — I10 Essential (primary) hypertension: Secondary | ICD-10-CM | POA: Diagnosis not present

## 2016-01-21 DIAGNOSIS — Z4789 Encounter for other orthopedic aftercare: Secondary | ICD-10-CM | POA: Diagnosis not present

## 2016-01-21 DIAGNOSIS — E119 Type 2 diabetes mellitus without complications: Secondary | ICD-10-CM | POA: Diagnosis not present

## 2016-01-21 DIAGNOSIS — Z87891 Personal history of nicotine dependence: Secondary | ICD-10-CM | POA: Diagnosis not present

## 2016-01-21 DIAGNOSIS — R531 Weakness: Secondary | ICD-10-CM | POA: Diagnosis not present

## 2016-01-21 DIAGNOSIS — R269 Unspecified abnormalities of gait and mobility: Secondary | ICD-10-CM | POA: Diagnosis not present

## 2016-01-26 DIAGNOSIS — M50322 Other cervical disc degeneration at C5-C6 level: Secondary | ICD-10-CM | POA: Diagnosis not present

## 2016-01-26 DIAGNOSIS — M5031 Other cervical disc degeneration,  high cervical region: Secondary | ICD-10-CM | POA: Diagnosis not present

## 2016-01-26 DIAGNOSIS — Z981 Arthrodesis status: Secondary | ICD-10-CM | POA: Diagnosis not present

## 2016-01-27 DIAGNOSIS — R2681 Unsteadiness on feet: Secondary | ICD-10-CM | POA: Diagnosis not present

## 2016-01-27 DIAGNOSIS — G9520 Unspecified cord compression: Secondary | ICD-10-CM | POA: Diagnosis not present

## 2016-01-29 DIAGNOSIS — G9520 Unspecified cord compression: Secondary | ICD-10-CM | POA: Diagnosis not present

## 2016-02-19 DIAGNOSIS — N5231 Erectile dysfunction following radical prostatectomy: Secondary | ICD-10-CM | POA: Diagnosis not present

## 2016-02-19 DIAGNOSIS — R109 Unspecified abdominal pain: Secondary | ICD-10-CM | POA: Diagnosis not present

## 2016-02-19 DIAGNOSIS — Z8546 Personal history of malignant neoplasm of prostate: Secondary | ICD-10-CM | POA: Diagnosis not present

## 2016-02-26 DIAGNOSIS — J181 Lobar pneumonia, unspecified organism: Secondary | ICD-10-CM | POA: Diagnosis not present

## 2016-02-26 DIAGNOSIS — Z8546 Personal history of malignant neoplasm of prostate: Secondary | ICD-10-CM | POA: Diagnosis not present

## 2016-02-26 DIAGNOSIS — R109 Unspecified abdominal pain: Secondary | ICD-10-CM | POA: Diagnosis not present

## 2016-02-26 DIAGNOSIS — R0989 Other specified symptoms and signs involving the circulatory and respiratory systems: Secondary | ICD-10-CM | POA: Diagnosis not present

## 2016-02-26 DIAGNOSIS — D179 Benign lipomatous neoplasm, unspecified: Secondary | ICD-10-CM | POA: Diagnosis not present

## 2016-02-26 DIAGNOSIS — D171 Benign lipomatous neoplasm of skin and subcutaneous tissue of trunk: Secondary | ICD-10-CM | POA: Diagnosis not present

## 2016-03-02 DIAGNOSIS — G9520 Unspecified cord compression: Secondary | ICD-10-CM | POA: Diagnosis not present

## 2016-03-02 DIAGNOSIS — M436 Torticollis: Secondary | ICD-10-CM | POA: Diagnosis not present

## 2016-03-02 DIAGNOSIS — R2681 Unsteadiness on feet: Secondary | ICD-10-CM | POA: Diagnosis not present

## 2016-03-07 DIAGNOSIS — G9520 Unspecified cord compression: Secondary | ICD-10-CM | POA: Diagnosis not present

## 2016-03-07 DIAGNOSIS — Z981 Arthrodesis status: Secondary | ICD-10-CM | POA: Diagnosis not present

## 2016-03-07 DIAGNOSIS — M4322 Fusion of spine, cervical region: Secondary | ICD-10-CM | POA: Diagnosis not present

## 2016-03-24 DIAGNOSIS — I10 Essential (primary) hypertension: Secondary | ICD-10-CM | POA: Diagnosis not present

## 2016-03-24 DIAGNOSIS — J189 Pneumonia, unspecified organism: Secondary | ICD-10-CM | POA: Diagnosis not present

## 2016-03-24 DIAGNOSIS — E119 Type 2 diabetes mellitus without complications: Secondary | ICD-10-CM | POA: Diagnosis not present

## 2016-03-24 DIAGNOSIS — E785 Hyperlipidemia, unspecified: Secondary | ICD-10-CM | POA: Diagnosis not present

## 2016-03-24 DIAGNOSIS — R05 Cough: Secondary | ICD-10-CM | POA: Diagnosis not present

## 2016-03-24 DIAGNOSIS — J690 Pneumonitis due to inhalation of food and vomit: Secondary | ICD-10-CM | POA: Diagnosis not present

## 2016-03-29 DIAGNOSIS — G9520 Unspecified cord compression: Secondary | ICD-10-CM | POA: Diagnosis not present

## 2016-03-31 DIAGNOSIS — G9520 Unspecified cord compression: Secondary | ICD-10-CM | POA: Diagnosis not present

## 2016-03-31 DIAGNOSIS — R2681 Unsteadiness on feet: Secondary | ICD-10-CM | POA: Diagnosis not present

## 2016-09-09 DIAGNOSIS — E119 Type 2 diabetes mellitus without complications: Secondary | ICD-10-CM | POA: Diagnosis not present

## 2016-09-09 DIAGNOSIS — E785 Hyperlipidemia, unspecified: Secondary | ICD-10-CM | POA: Diagnosis not present

## 2016-09-09 DIAGNOSIS — I10 Essential (primary) hypertension: Secondary | ICD-10-CM | POA: Diagnosis not present

## 2017-03-27 ENCOUNTER — Ambulatory Visit (INDEPENDENT_AMBULATORY_CARE_PROVIDER_SITE_OTHER): Payer: PPO | Admitting: Sports Medicine

## 2017-03-27 DIAGNOSIS — M7061 Trochanteric bursitis, right hip: Secondary | ICD-10-CM

## 2017-03-27 NOTE — Progress Notes (Signed)
  Subjective:    CC: hip pain  HPI: Patient reports ongoing hip pain for many years. Patient describes the pain as dull and achy that comes and goes. It is 7/10 in severity at its worst. He localizes the pain over the right greater trochanter. Patient reports increased activity recently with subsequent worsening of his pain. Patient denies any other inciting events.  Patient endorses occasional difficulty with climbing stairs.    Past medical history:  Negative.  See flowsheet/record as well for more information.  Surgical history: Negative.  See flowsheet/record as well for more information.  Family history: Negative.  See flowsheet/record as well for more information.  Social history: Negative.  See flowsheet/record as well for more information.  Allergies, and medications have been entered into the medical record, reviewed, and no changes needed.   Review of Systems: No fevers, chills, night sweats, weight loss, chest pain, or shortness of breath.   Objective:    General: Well Developed, well nourished, and in no acute distress.  Neuro: Alert and oriented x3, extra-ocular muscles intact, sensation grossly intact.  HEENT: Normocephalic, atraumatic, pupils equal round reactive to light, neck supple, no masses, no lymphadenopathy, thyroid nonpalpable.  Skin: Warm and dry, no rashes. Cardiac: Regular rate and rhythm, no murmurs rubs or gallops, no lower extremity edema.  Respiratory: Clear to auscultation bilaterally. Not using accessory muscles, speaking in full sentences. MSK: Right hip: No gross deformity or swelling on inspection Tender to palpation at greater trochanter, hip extension reproduces pain at area of greater trochanter Range of motion is full Strength is 4/5 on leg flexion and extension, there is marked weakness with hip abduction   Impression and Recommendations:    71 year-old male with hip pain. Patient has a history of greater trochanteric bursitis with a similar  presentation. This is likely a recurrence. Patient received a steroid injection today in clinic. The importance of following-up with physical therapy was discussed with the patient. Patient will continue to monitor for improvement in pain and return to clinic if symptoms do not improve or worsen.

## 2017-03-27 NOTE — Assessment & Plan Note (Signed)
Recurrence of pain, injection as above, formal physical therapy.  Return in one month. Hip abductor's were extremely weak on the right and moderately weak on the left.

## 2017-04-11 ENCOUNTER — Ambulatory Visit: Payer: PPO | Attending: Sports Medicine | Admitting: Physical Therapy

## 2017-04-11 DIAGNOSIS — R262 Difficulty in walking, not elsewhere classified: Secondary | ICD-10-CM | POA: Diagnosis present

## 2017-04-11 DIAGNOSIS — M6281 Muscle weakness (generalized): Secondary | ICD-10-CM

## 2017-04-11 DIAGNOSIS — M25551 Pain in right hip: Secondary | ICD-10-CM | POA: Diagnosis present

## 2017-04-11 NOTE — Patient Instructions (Signed)
Hamstring Step 2   Left foot relaxed, knee straight, other leg bent, foot flat. Raise straight leg further upward to maximal range. Hold _30__ seconds. Relax leg completely down. Repeat __3_ times.  Bridging   Slowly raise buttocks from floor, keeping stomach tight. Repeat _15___ times per set. Do __2__ sets per session.   HIP: Abduction - Side-Lying   Lie on side, legs straight and in line with trunk. Squeeze glutes. Raise top leg up and slightly back. Point toes forward. _15__ reps per set, __2_ sets per day. Bend bottom leg to stabilize pelvis.  Strengthening: Straight Leg Raise (Phase 1)   Tighten muscles on front of right thigh, then lift leg _8-12___ inches from surface, keeping knee locked.  Repeat __15__ times per set. Do _2___ sets per session.

## 2017-04-11 NOTE — Therapy (Signed)
[facility] [address]  [place], [place], [address] Phone: [phone]   Fax:  [phone]  Physical Therapy Evaluation  Patient Details  Name: Anthony Peterson MRN: [id number] Date of Birth: 1946/01/06 Referring Provider: Dr. Dianah Field  Encounter Date: 04/11/2017      PT End of Session - 04/11/17 1038    Visit Number 1   Number of Visits 8   Date for PT Re-Evaluation 05/09/17   Authorization Type [employer]   PT Start Time 1013   PT Stop Time 1044   PT Time Calculation (min) 31 min   Activity Tolerance Patient tolerated treatment well   Behavior During Therapy [facility] for tasks assessed/performed      Past Medical History:  Diagnosis Date  . Cancer [facility]) 1999   Prostate    Past Surgical History:  Procedure Laterality Date  . PROSTATE SURGERY      There were no vitals filed for this visit.       Subjective Assessment - 04/11/17 1014    Subjective Patient reporting R hip pain - had an injection by Dr. Darene Lamer - feeling better. Has some difficulty walking - residual R leg weakness - "years" with no known reason. Denies N&T into leg/feet.    Pertinent History DM, spinal surgery   Patient Stated Goals improve strength of R hip/leg   Currently in Pain? No/denies            [facility] PT Assessment - 04/11/17 1017      Assessment   Medical Diagnosis Trochanteric bursitis of R hip   Referring Provider Dr. Dianah Field   Next MD Visit --  ~4 weeks   Prior Therapy yes - years ago     Precautions   Precautions None     Restrictions   Weight Bearing Restrictions No     Balance Screen   Has the patient fallen in the past 6 months No   Has the patient had a decrease in activity level because of a fear of falling?  No   Is the patient reluctant to leave their home because of a fear of falling?  No     Home Environment   Living Environment Private residence   Living Arrangements Spouse/significant  other   Type of [place] Access Level entry   [place] One level   Additional Comments walking stick - "when I feel like it"     Prior Function   Level of Independence Independent   Vocation Retired     Associate Professor   Overall Cognitive Status Within Functional Limits for tasks assessed     Observation/Other Assessments   Focus on Therapeutic Outcomes (FOTO)  Hip: 42 (58% limited, predicted 44% limited)     Sensation   Light Touch Appears Intact     Coordination   Gross Motor Movements are Fluid and Coordinated Yes     Posture/Postural Control   Posture/Postural Control No significant limitations     ROM / Strength   AROM / PROM / Strength AROM;Strength     AROM   Overall AROM  Within functional limits for tasks performed   Overall AROM Comments B LE     Strength   Strength Assessment Site Hip;Knee   Right/Left Hip Right;Left   Right Hip Flexion 4/5   Right Hip Extension 3+/5   Right Hip ABduction 3+/5   Left Hip Flexion 5/5   Left Hip Extension 4+/5  Left Hip ABduction 4+/5   Right/Left Knee Right;Left   Right Knee Flexion 4-/5   Right Knee Extension 4+/5   Left Knee Flexion 5/5   Left Knee Extension 5/5     Flexibility   Soft Tissue Assessment /Muscle Length yes   Hamstrings B: moderate tightness   Quadriceps B: heel approx 12 in from buttock     Palpation   Palpation comment non-tender to R greater trochanter     Ambulation/Gait   Ambulation/Gait Yes   Ambulation Distance (Feet) 100 Feet   Assistive device None   Gait Pattern Decreased hip/knee flexion - right;Decreased dorsiflexion - right;Decreased weight shift to right;Ataxic;Poor foot clearance - right   Ambulation Surface Level;Indoor            Objective measurements completed on examination: See above findings.          [facility] Adult PT Treatment/Exercise - 04/11/17 1017      Exercises   Exercises Knee/Hip     Knee/Hip Exercises: Stretches   Passive Hamstring Stretch  Right;Left;3 reps;30 seconds   Passive Hamstring Stretch Limitations supine with strap     Knee/Hip Exercises: Supine   Bridges Both;15 reps   Straight Leg Raises Right;Left;10 reps     Knee/Hip Exercises: Sidelying   Hip ABduction Right;10 reps   Hip ABduction Limitations heavy TC at buttock to reduce posterior roll                PT Education - 04/11/17 1037    Education provided Yes   Education Details exam findings, POC, HEP   Person(s) Educated Patient   Methods Explanation;Demonstration;Handout   Comprehension Verbalized understanding;Returned demonstration;Need further instruction             PT Long Term Goals - 04/11/17 1112      PT LONG TERM GOAL #1   Title patient to be independent with advanced HEP    Time 4   Period Weeks   Status New   Target Date 05/09/17     PT LONG TERM GOAL #2   Title patient to improve R hip strength by 1 muscle grade   Time 4   Period Weeks   Status New   Target Date 05/09/17     PT LONG TERM GOAL #3   Title Patient to improve HS flexibility to approx 80 degrees, and quad flexibility to ~5 inch from buttock   Time 4   Period Weeks   Status New   Target Date 05/09/17     PT LONG TERM GOAL #4   Title Patient to demosntrate good heel toe gait pattern with improve weight shift as well as hip/knee flexion during swing phase of gait.    Time 4   Period Weeks   Status New   Target Date 05/09/17     PT LONG TERM GOAL #5   Title Patient to report 0/10 hip pain for > 2 weeks   Time 4   Period Weeks   Status New   Target Date 05/09/17                Plan - 04/11/17 1106    Clinical Impression Statement [name] is a 71 y/o male presenting to [facility] today regarding priamry complaints of recent R hip pain and general R hip/LE weakness. Patient with a near ataxic gait pattern, of which patient reports he has had no diagnosis related to his gait, but does have a history of spinal cord compression per  chart  review. Patient with reduced pain symptoms at R hip since injection, however, does present with noted weakness of R hip, especially with hip abduction and extension, as compared to uninvolved LE. Patient given initial HEP for gentle hip stretching and strengthening wiht good tolerance. patient to beneift form PT to address functional mobility limitations.    Clinical Presentation Stable   Clinical Presentation due to: stable medical condition - gait likely stable for many years   Clinical Decision Making Low   Rehab Potential Good   PT Frequency 2x / week   PT Duration 4 weeks   PT Treatment/Interventions ADLs/Self Care Home Management;Cryotherapy;Electrical Stimulation;Iontophoresis 4mg /ml Dexamethasone;Moist Heat;Ultrasound;Neuromuscular re-education;Balance training;Therapeutic exercise;Therapeutic activities;Functional mobility training;Stair training;Gait training;Patient/family education;Manual techniques;Passive range of motion;Vasopneumatic Device;Taping;Dry needling   PT Next Visit Plan anterior/posterior hip stretching, hip strengthening   Consulted and Agree with Plan of Care Patient      Patient will benefit from skilled therapeutic intervention in order to improve the following deficits and impairments:  Abnormal gait, Decreased activity tolerance, Decreased mobility, Decreased strength, Difficulty walking, Pain  Visit Diagnosis: Pain in right hip - Plan: PT plan of care cert/re-cert  Muscle weakness (generalized) - Plan: PT plan of care cert/re-cert  Difficulty in walking, not elsewhere classified - Plan: PT plan of care cert/re-cert      G-Codes - 40/97/35 1115    Functional Assessment Tool Used (Outpatient Only) FOTO: 42 (58% limited)   Functional Limitation Mobility: Walking and moving around   Mobility: Walking and Moving Around Current Status (H2992) At least 40 percent but less than 60 percent impaired, limited or restricted   Mobility: Walking and Moving Around Goal  Status (E2683) At least 40 percent but less than 60 percent impaired, limited or restricted       Problem List Patient Active Problem List   Diagnosis Date Noted  . Trochanteric bursitis of right hip 07/14/2015  . Primary osteoarthritis of right knee 07/14/2015     Lanney Gins, PT, DPT 04/11/17 11:18 AM   [facility] [address]  [place], [place], [address] Phone: [phone]   Fax:  [phone]  Name: DIONYSIOS MASSMAN MRN: [id number] Date of Birth: 05/18/1946

## 2017-04-14 ENCOUNTER — Ambulatory Visit: Payer: PPO

## 2017-04-14 DIAGNOSIS — M25551 Pain in right hip: Secondary | ICD-10-CM

## 2017-04-14 DIAGNOSIS — R262 Difficulty in walking, not elsewhere classified: Secondary | ICD-10-CM

## 2017-04-14 DIAGNOSIS — M6281 Muscle weakness (generalized): Secondary | ICD-10-CM

## 2017-04-14 NOTE — Therapy (Signed)
[facility] [address]  [place], [place], [address] Phone: [phone]   Fax:  [phone]  Physical Therapy Treatment  Patient Details  Name: Anthony Peterson MRN: [id number] Date of Birth: [birth year] Referring Provider: Dr. Dianah Field  Encounter Date: 04/14/2017      PT End of Session - 04/14/17 0902    Visit Number 2   Number of Visits 8   Date for PT Re-Evaluation 05/09/17   Authorization Type [employer]   PT Start Time 0845   PT Stop Time 0930   PT Time Calculation (min) 45 min   Activity Tolerance Patient tolerated treatment well   Behavior During Therapy [facility] for tasks assessed/performed      Past Medical History:  Diagnosis Date  . Cancer [facility]) 1999   Prostate    Past Surgical History:  Procedure Laterality Date  . PROSTATE SURGERY      There were no vitals filed for this visit.      Subjective Assessment - 04/14/17 0849    Subjective Notes no issues with HEP.  Doing well today.     Patient Stated Goals improve strength of R hip/leg   Currently in Pain? No/denies   Multiple Pain Sites No                         [facility] Adult PT Treatment/Exercise - 04/14/17 0857      Knee/Hip Exercises: Stretches   Passive Hamstring Stretch Right;2 reps;30 seconds   Passive Hamstring Stretch Limitations supine with strap   Hip Flexor Stretch Right;1 rep;30 seconds   Hip Flexor Stretch Limitations in mod thomas position with strap      Knee/Hip Exercises: Aerobic   Stationary Bike REcumbent bike: lvl 2, 6 min      Knee/Hip Exercises: Supine   Bridges Both;15 reps   Bridges with [employer] 15 reps  with ball    Bridges with Clamshell Both;15 reps  B sustained hip abd/ER with red TB at knees   Straight Leg Raises Right;10 reps;Strengthening   Straight Leg Raises Limitations 1#    Other Supine Knee/Hip Exercises Hooklying alternating hip Abd/ER with red TB at knees x 15 reps  each leg     Knee/Hip Exercises: Sidelying   Hip ABduction Right;10 reps;2 sets   Hip ABduction Limitations heavy TC at buttock to reduce posterior roll   Clams R clam shell without resistance x 10 reps; tactile cues to prevent trunk rotation     Knee/Hip Exercises: Prone   Hip Extension Right;15 reps   Hip Extension Limitations Straight leg                      PT Long Term Goals - 04/14/17 0903      PT LONG TERM GOAL #1   Title patient to be independent with advanced HEP    Time 4   Period Weeks   Status On-going     PT LONG TERM GOAL #2   Title patient to improve R hip strength by 1 muscle grade   Time 4   Period Weeks   Status On-going     PT LONG TERM GOAL #3   Title Patient to improve HS flexibility to approx 80 degrees, and quad flexibility to ~5 inch from buttock   Time 4   Period Weeks   Status On-going     PT LONG TERM GOAL #4  Title Patient to demosntrate good heel toe gait pattern with improve weight shift as well as hip/knee flexion during swing phase of gait.    Time 4   Period Weeks   Status On-going     PT LONG TERM GOAL #5   Title Patient to report 0/10 hip pain for > 2 weeks   Time 4   Period Weeks   Status On-going               Plan - 04/14/17 0904    Clinical Impression Statement Pt. doing well today noting he has had no issued with HEP.  Tolerated hip strengthening activity focused on R-sided strengthening well today.  Constant cueing required with sidelying abduction and clamshell to prevent substitution.  Will continued to progress therex per pt. in coming visits.   PT Treatment/Interventions ADLs/Self Care Home Management;Cryotherapy;Electrical Stimulation;Iontophoresis 4mg /ml Dexamethasone;Moist Heat;Ultrasound;Neuromuscular re-education;Balance training;Therapeutic exercise;Therapeutic activities;Functional mobility training;Stair training;Gait training;Patient/family education;Manual techniques;Passive range of  motion;Vasopneumatic Device;Taping;Dry needling   PT Next Visit Plan anterior/posterior hip stretching, hip strengthening      Patient will benefit from skilled therapeutic intervention in order to improve the following deficits and impairments:  Abnormal gait, Decreased activity tolerance, Decreased mobility, Decreased strength, Difficulty walking, Pain  Visit Diagnosis: Pain in right hip  Muscle weakness (generalized)  Difficulty in walking, not elsewhere classified     Problem List Patient Active Problem List   Diagnosis Date Noted  . Trochanteric bursitis of right hip 07/14/2015  . Primary osteoarthritis of right knee 07/14/2015    Bess Harvest, PTA 04/14/17 11:38 AM   [facility] [address]  [place], [place], [address] Phone: [phone]   Fax:  [phone]  Name: Anthony Peterson MRN: [id number] Date of Birth: Nov 06, 1945

## 2017-04-18 ENCOUNTER — Ambulatory Visit: Payer: PPO | Admitting: Physical Therapy

## 2017-04-18 DIAGNOSIS — M25551 Pain in right hip: Secondary | ICD-10-CM

## 2017-04-18 DIAGNOSIS — R262 Difficulty in walking, not elsewhere classified: Secondary | ICD-10-CM

## 2017-04-18 DIAGNOSIS — M6281 Muscle weakness (generalized): Secondary | ICD-10-CM

## 2017-04-18 NOTE — Therapy (Signed)
[facility] [address]  [place], [place], [address] Phone: [phone]   Fax:  [phone]  Physical Therapy Treatment  Patient Details  Name: Anthony Peterson MRN: [id number] Date of Birth: 10/27/1945 Referring Provider: Dr. Dianah Field  Encounter Date: 04/18/2017      PT End of Session - 04/18/17 1106    Visit Number 3   Number of Visits 8   Date for PT Re-Evaluation 05/09/17   Authorization Type [employer]   PT Start Time 1104   PT Stop Time 1143   PT Time Calculation (min) 39 min   Activity Tolerance Patient tolerated treatment well   Behavior During Therapy [facility] for tasks assessed/performed      Past Medical History:  Diagnosis Date  . Cancer [facility]) 1999   Prostate    Past Surgical History:  Procedure Laterality Date  . PROSTATE SURGERY      There were no vitals filed for this visit.      Subjective Assessment - 04/18/17 1105    Subjective feeling well - no new complaints; had some soreness after last session   Pertinent History DM, spinal surgery   Patient Stated Goals improve strength of R hip/leg   Currently in Pain? No/denies   Multiple Pain Sites No                         [facility] Adult PT Treatment/Exercise - 04/18/17 1107      Knee/Hip Exercises: Aerobic   Recumbent Bike L3 x 6 minutes     Knee/Hip Exercises: Machines for Strengthening   Cybex Knee Extension 20# B con/R ecc x 15   Cybex Knee Flexion 20# B con/R ecc x 15     Knee/Hip Exercises: Standing   Heel Raises Both;15 reps   Functional Squat 15 reps   Functional Squat Limitations UE support   Wall Squat 15 reps;3 seconds   Other Standing Knee Exercises side stepping - 30 feet each direction - red tband   Other Standing Knee Exercises standing hip drop - R LE x 15 reps     Knee/Hip Exercises: Seated   Sit to Sand 15 reps;without UE support  standing on [employer]; focus on eccentric lowering      Knee/Hip Exercises: Supine   Straight Leg Raises Strengthening;Right;15 reps   Straight Leg Raises Limitations 1#     Knee/Hip Exercises: Sidelying   Hip ABduction Strengthening;Right;15 reps   Hip ABduction Limitations 1#                     PT Long Term Goals - 04/14/17 0903      PT LONG TERM GOAL #1   Title patient to be independent with advanced HEP    Time 4   Period Weeks   Status On-going     PT LONG TERM GOAL #2   Title patient to improve R hip strength by 1 muscle grade   Time 4   Period Weeks   Status On-going     PT LONG TERM GOAL #3   Title Patient to improve HS flexibility to approx 80 degrees, and quad flexibility to ~5 inch from buttock   Time 4   Period Weeks   Status On-going     PT LONG TERM GOAL #4   Title Patient to demosntrate good heel toe gait pattern with improve weight shift as well as hip/knee flexion during swing phase  of gait.    Time 4   Period Weeks   Status On-going     PT LONG TERM GOAL #5   Title Patient to report 0/10 hip pain for > 2 weeks   Time 4   Period Weeks   Status On-going               Plan - 04/18/17 1106    Clinical Impression Statement Patient doing well today - noting some muscle soreness from last session that resolved in approx 1 day. PT session today focusing on proximal hip strengthening with expected fatigue noted at end of session. WIll continue to progress towards goals.    PT Treatment/Interventions ADLs/Self Care Home Management;Cryotherapy;Electrical Stimulation;Iontophoresis 4mg /ml Dexamethasone;Moist Heat;Ultrasound;Neuromuscular re-education;Balance training;Therapeutic exercise;Therapeutic activities;Functional mobility training;Stair training;Gait training;Patient/family education;Manual techniques;Passive range of motion;Vasopneumatic Device;Taping;Dry needling   PT Next Visit Plan anterior/posterior hip stretching, hip strengthening   Consulted and Agree with Plan of Care Patient       Patient will benefit from skilled therapeutic intervention in order to improve the following deficits and impairments:  Abnormal gait, Decreased activity tolerance, Decreased mobility, Decreased strength, Difficulty walking, Pain  Visit Diagnosis: Pain in right hip  Muscle weakness (generalized)  Difficulty in walking, not elsewhere classified     Problem List Patient Active Problem List   Diagnosis Date Noted  . Trochanteric bursitis of right hip 07/14/2015  . Primary osteoarthritis of right knee 07/14/2015     Lanney Gins, PT, DPT 04/18/17 11:51 AM   [facility] [address]  [place], [place], [address] Phone: [phone]   Fax:  [phone]  Name: Anthony Peterson MRN: [id number] Date of Birth: [birth year]

## 2017-04-21 ENCOUNTER — Ambulatory Visit: Payer: PPO | Admitting: Physical Therapy

## 2017-04-25 ENCOUNTER — Ambulatory Visit: Payer: PPO

## 2017-04-25 DIAGNOSIS — M25551 Pain in right hip: Secondary | ICD-10-CM

## 2017-04-25 DIAGNOSIS — R262 Difficulty in walking, not elsewhere classified: Secondary | ICD-10-CM

## 2017-04-25 DIAGNOSIS — M6281 Muscle weakness (generalized): Secondary | ICD-10-CM

## 2017-04-25 NOTE — Therapy (Signed)
Anthony Peterson [address]  [place], [place], [address] Phone: [phone]   Fax:  [phone]  Physical Therapy Treatment  Patient Details  Name: Anthony Peterson MRN: [id number] Date of Birth: 1946/07/09 Referring Provider: Dr. Dianah Field  Encounter Date: 04/25/2017      PT End of Session - 04/25/17 1115    Visit Number 4   Number of Visits 8   Date for PT Re-Evaluation 05/09/17   Authorization Type [employer]   PT Start Time 1106   PT Stop Time 1146   PT Time Calculation (min) 40 min   Activity Tolerance Patient tolerated treatment well   Behavior During Therapy [facility] for tasks assessed/performed      Past Medical History:  Diagnosis Date  . Cancer [facility]) 1999   Prostate    Past Surgical History:  Procedure Laterality Date  . PROSTATE SURGERY      There were no vitals filed for this visit.      Subjective Assessment - 04/25/17 1110    Subjective Pt. doing well able to mow yard without hip pain over weekend.     Patient Stated Goals improve strength of R hip/leg   Currently in Pain? No/denies   Multiple Pain Sites No            [facility] PT Assessment - 04/25/17 1118      Assessment   Next MD Visit   9.10.18                     [facility] Adult PT Treatment/Exercise - 04/25/17 1117      Knee/Hip Exercises: Aerobic   Recumbent Bike L3 x 6 minutes     Knee/Hip Exercises: Machines for Strengthening   Cybex Knee Extension 25# B con/R ecc x 15   Cybex Knee Flexion 25# B con/R ecc x 15     Knee/Hip Exercises: Standing   Hip Abduction Right;Left;10 reps;Knee straight   Hip Extension Right;Left;10 reps;Knee straight   Wall Squat 15 reps;3 seconds     Knee/Hip Exercises: Supine   Bridges Both;15 reps   Bridges with Clamshell Both;15 reps  green TB    Straight Leg Raises Strengthening;Right;15 reps   Straight Leg Raises Limitations 1#   Other Supine Knee/Hip Exercises Hooklying  alternating hip Abd/ER with green TB at knees x 15 reps each leg     Knee/Hip Exercises: Sidelying   Hip ABduction Strengthening;Right;15 reps   Hip ABduction Limitations 1#                     PT Long Term Goals - 04/14/17 0903      PT LONG TERM GOAL #1   Title patient to be independent with advanced HEP    Time 4   Period Weeks   Status On-going     PT LONG TERM GOAL #2   Title patient to improve R hip strength by 1 muscle grade   Time 4   Period Weeks   Status On-going     PT LONG TERM GOAL #3   Title Patient to improve HS flexibility to approx 80 degrees, and quad flexibility to ~5 inch from buttock   Time 4   Period Weeks   Status On-going     PT LONG TERM GOAL #4   Title Patient to demosntrate good heel toe gait pattern with improve weight shift as well as hip/knee flexion during swing phase of gait.  Time 4   Period Weeks   Status On-going     PT LONG TERM GOAL #5   Title Patient to report 0/10 hip pain for > 2 weeks   Time 4   Period Weeks   Status On-going               Plan - 04/25/17 1119    Clinical Impression Statement Pt. doing well today noting continued relief of hip pain able to mow yard without issue.  Reports consistent adherence to HEP.  Tolerated mild progression with therex well today focusing on R hip strengthening activities.  Pt. verbalizing LE fatigue following treatment.  Seems to progressing and responding to therex well.      PT Treatment/Interventions ADLs/Self Care Home Management;Cryotherapy;Electrical Stimulation;Iontophoresis 4mg /ml Dexamethasone;Moist Heat;Ultrasound;Neuromuscular re-education;Balance training;Therapeutic exercise;Therapeutic activities;Functional mobility training;Stair training;Gait training;Patient/family education;Manual techniques;Passive range of motion;Vasopneumatic Device;Taping;Dry needling   PT Next Visit Plan anterior/posterior hip stretching, hip strengthening      Patient will  benefit from skilled therapeutic intervention in order to improve the following deficits and impairments:  Abnormal gait, Decreased activity tolerance, Decreased mobility, Decreased strength, Difficulty walking, Pain  Visit Diagnosis: Pain in right hip  Muscle weakness (generalized)  Difficulty in walking, not elsewhere classified     Problem List Patient Active Problem List   Diagnosis Date Noted  . Trochanteric bursitis of right hip 07/14/2015  . Primary osteoarthritis of right knee 07/14/2015    Bess Harvest, PTA 04/25/17 1:23 PM  [facility] [address]  [place], [place], [address] Phone: [phone]   Fax:  [phone]  Name: Anthony Peterson MRN: [id number] Date of Birth: August 04, 1946

## 2017-04-28 ENCOUNTER — Ambulatory Visit: Payer: PPO

## 2017-04-28 DIAGNOSIS — R262 Difficulty in walking, not elsewhere classified: Secondary | ICD-10-CM

## 2017-04-28 DIAGNOSIS — M25551 Pain in right hip: Secondary | ICD-10-CM | POA: Diagnosis not present

## 2017-04-28 DIAGNOSIS — M6281 Muscle weakness (generalized): Secondary | ICD-10-CM

## 2017-04-28 NOTE — Therapy (Signed)
[facility] [address]  [place], [place], [address] Phone: [phone]   Fax:  [phone]  Physical Therapy Treatment  Patient Details  Name: Anthony Peterson MRN: [id number] Date of Birth: [birth year] Referring Provider: Dr. Dianah Field  Encounter Date: 04/28/2017      PT End of Session - 04/28/17 1129    Visit Number 5   Number of Visits 8   Date for PT Re-Evaluation 05/09/17   Authorization Type [employer]   PT Start Time 1111  pt. arrived late    PT Stop Time 1155   PT Time Calculation (min) 44 min   Activity Tolerance Patient tolerated treatment well   Behavior During Therapy [facility] for tasks assessed/performed      Past Medical History:  Diagnosis Date  . Cancer [facility]) 1999   Prostate    Past Surgical History:  Procedure Laterality Date  . PROSTATE SURGERY      There were no vitals filed for this visit.      Subjective Assessment - 04/28/17 1119    Subjective Pt. doing well today no new complaints.     Patient Stated Goals improve strength of R hip/leg   Currently in Pain? No/denies   Multiple Pain Sites No                         [facility] Adult PT Treatment/Exercise - 04/28/17 1124      Knee/Hip Exercises: Aerobic   Recumbent Bike L3 x 6 minutes     Knee/Hip Exercises: Standing   Heel Raises Both;20 reps   Knee Flexion Right;Left;20 reps   Knee Flexion Limitations 1#; at counter    Forward Lunges 10 reps;3 seconds   Hip Abduction Right;Left;10 reps;Knee straight   Abduction Limitations 1#; at counter    Hip Extension Right;Left;10 reps;Knee straight   Extension Limitations 1#; 45 dg kick back   at counter    Functional Squat 15 reps   Functional Squat Limitations at counter;   cues required for LE spacing    Wall Squat --   Wall Squat Limitations --     Knee/Hip Exercises: Seated   Sit to Sand without UE support;10 reps  sit to stand from 15" machine seat      Knee/Hip Exercises: Prone   Hip Extension Right;15 reps   Hip Extension Limitations 1#; Straight leg                 PT Education - 04/28/17 1159    Education provided Yes   Education Details Counter squat, sit<>stand (pt. states he already has these home exercises however not in chart thus issued pictures)   Person(s) Educated Patient   Methods Explanation;Demonstration;Verbal cues;Handout   Comprehension Verbalized understanding;Returned demonstration;Verbal cues required;Need further instruction             PT Long Term Goals - 04/14/17 0903      PT LONG TERM GOAL #1   Title patient to be independent with advanced HEP    Time 4   Period Weeks   Status On-going     PT LONG TERM GOAL #2   Title patient to improve R hip strength by 1 muscle grade   Time 4   Period Weeks   Status On-going     PT LONG TERM GOAL #3   Title Patient to improve HS flexibility to approx 80 degrees, and quad flexibility to ~5 inch from  buttock   Time 4   Period Weeks   Status On-going     PT LONG TERM GOAL #4   Title Patient to demosntrate good heel toe gait pattern with improve weight shift as well as hip/knee flexion during swing phase of gait.    Time 4   Period Weeks   Status On-going     PT LONG TERM GOAL #5   Title Patient to report 0/10 hip pain for > 2 weeks   Time 4   Period Weeks   Status On-going               Plan - 04/28/17 1123    Clinical Impression Statement [name] doing well today noting still pain free.  Functional strengthening progressed today with pt. tolerating well.  Heavy cues required for proper technique with squatting and sit<>stand to prevent Valgus angles at knees.  Pt. seems to be progressing well.   PT Treatment/Interventions ADLs/Self Care Home Management;Cryotherapy;Electrical Stimulation;Iontophoresis 4mg /ml Dexamethasone;Moist Heat;Ultrasound;Neuromuscular re-education;Balance training;Therapeutic exercise;Therapeutic  activities;Functional mobility training;Stair training;Gait training;Patient/family education;Manual techniques;Passive range of motion;Vasopneumatic Device;Taping;Dry needling   PT Next Visit Plan Monitor tolerance to progression of functional strength on 8.31; anterior/posterior hip stretching, hip strengthening      Patient will benefit from skilled therapeutic intervention in order to improve the following deficits and impairments:  Abnormal gait, Decreased activity tolerance, Decreased mobility, Decreased strength, Difficulty walking, Pain  Visit Diagnosis: Pain in right hip  Muscle weakness (generalized)  Difficulty in walking, not elsewhere classified     Problem List Patient Active Problem List   Diagnosis Date Noted  . Trochanteric bursitis of right hip 07/14/2015  . Primary osteoarthritis of right knee 07/14/2015    Bess Harvest, PTA 04/28/17 12:11 PM  [facility] [address]  [place], [place], [address] Phone: [phone]   Fax:  [phone]  Name: Anthony [facility] MRN: [id number] Date of Birth: 05-28-1946

## 2017-05-02 ENCOUNTER — Ambulatory Visit: Payer: PPO | Attending: Family Medicine

## 2017-05-02 DIAGNOSIS — M25551 Pain in right hip: Secondary | ICD-10-CM | POA: Insufficient documentation

## 2017-05-02 DIAGNOSIS — R262 Difficulty in walking, not elsewhere classified: Secondary | ICD-10-CM | POA: Insufficient documentation

## 2017-05-02 DIAGNOSIS — M6281 Muscle weakness (generalized): Secondary | ICD-10-CM

## 2017-05-02 NOTE — Therapy (Addendum)
[facility] [address]  [place], [place], [address] Phone: [phone]   Fax:  [phone]  Physical Therapy Treatment  Patient Details  Name: Anthony Peterson MRN: [id number] Date of Birth: 1945/11/27 Referring Provider: Dr. Dianah Field  Encounter Date: 05/02/2017      PT End of Session - 05/02/17 1122    Visit Number 6   Number of Visits 8   Date for PT Re-Evaluation 05/09/17   Authorization Type [employer]   PT Start Time 1116  pt. arrived late    PT Stop Time 1154   PT Time Calculation (min) 38 min   Activity Tolerance Patient tolerated treatment well   Behavior During Therapy [facility] for tasks assessed/performed      Past Medical History:  Diagnosis Date  . Cancer [facility]) 1999   Prostate    Past Surgical History:  Procedure Laterality Date  . PROSTATE SURGERY      There were no vitals filed for this visit.      Subjective Assessment - 05/02/17 1119    Subjective Pt. doing well today no new complaints.     Currently in Pain? No/denies   Multiple Pain Sites No                         [facility] Adult PT Treatment/Exercise - 05/02/17 1127      Knee/Hip Exercises: Stretches   Passive Hamstring Stretch Right;2 reps;30 seconds   Passive Hamstring Stretch Limitations supine with strap     Knee/Hip Exercises: Aerobic   Recumbent Bike L3 x 6 minutes     Knee/Hip Exercises: Machines for Strengthening   Cybex Knee Extension 25# B con/R ecc x 15   Cybex Knee Flexion 25# B con/R ecc x 15     Knee/Hip Exercises: Standing   Knee Flexion Right;Left;10 reps   Knee Flexion Limitations 2#; at counter    Hip Flexion Right;Left;10 reps;Knee bent   Hip Flexion Limitations 2#; at counter    Hip Abduction Right;Left;10 reps;Knee straight   Abduction Limitations 2#; at counter    Hip Extension Right;Left;10 reps;Knee straight   Extension Limitations 2#; 45 dg kick back    Other Standing Knee  Exercises side stepping - 50 feet each direction - red tband     Knee/Hip Exercises: Seated   Sit to Sand without UE support;15 reps  from 15" machine seat; 2 sets      Knee/Hip Exercises: Sidelying   Hip ABduction 2 sets;Right;20 reps   Hip ABduction Limitations 1#                      PT Long Term Goals - 04/14/17 0903      PT LONG TERM GOAL #1   Title patient to be independent with advanced HEP    Time 4   Period Weeks   Status On-going     PT LONG TERM GOAL #2   Title patient to improve R hip strength by 1 muscle grade   Time 4   Period Weeks   Status On-going     PT LONG TERM GOAL #3   Title Patient to improve HS flexibility to approx 80 degrees, and quad flexibility to ~5 inch from buttock   Time 4   Period Weeks   Status On-going     PT LONG TERM GOAL #4   Title Patient to demosntrate good heel toe gait pattern  with improve weight shift as well as hip/knee flexion during swing phase of gait.    Time 4   Period Weeks   Status On-going     PT LONG TERM GOAL #5   Title Patient to report 0/10 hip pain for > 2 weeks   Time 4   Period Weeks   Status On-going               Plan - 05/02/17 1151    Clinical Impression Statement Pt. arrived significantly late to therapy today thus treatment time limited.  Pt. seems to be responding well to all strengthening therex in treatment and remains pain free with all activities. Treatment focused on functional strengthening with some targeted R hip strengthening today.  Able to progress reps with R hip abduction however still visibly weak in this motion.  Pt. noting improved ability to "work in yard with boots" today.  Seems to be progressing well.      PT Treatment/Interventions ADLs/Self Care Home Management;Cryotherapy;Electrical Stimulation;Iontophoresis 44m/ml Dexamethasone;Moist Heat;Ultrasound;Neuromuscular re-education;Balance training;Therapeutic exercise;Therapeutic activities;Functional mobility  training;Stair training;Gait training;Patient/family education;Manual techniques;Passive range of motion;Vasopneumatic Device;Taping;Dry needling   PT Next Visit Plan Anterior/posterior hip stretching, hip strengthening      Patient will benefit from skilled therapeutic intervention in order to improve the following deficits and impairments:  Abnormal gait, Decreased activity tolerance, Decreased mobility, Decreased strength, Difficulty walking, Pain  Visit Diagnosis: Pain in right hip  Muscle weakness (generalized)  Difficulty in walking, not elsewhere classified     Problem List Patient Active Problem List   Diagnosis Date Noted  . Trochanteric bursitis of right hip 07/14/2015  . Primary osteoarthritis of right knee 07/14/2015    MBess Harvest PTA 05/02/17 12:03 PM   PHYSICAL THERAPY DISCHARGE SUMMARY  Visits from Start of Care: 6  Current functional level related to goals / functional outcomes: See above   Remaining deficits: See above, some gait deficits likely due to spinal cord compression of chronic issue   Education / Equipment: HEP  Plan: Patient agrees to discharge.  Patient goals were not met. Patient is being discharged due to not returning since the last visit.  ?????    Patient with 3 no shows, d/c from PT.  SLanney Gins PT, DPT 07/11/17 2:35 PM    [facility] [address] [place] [address]Phone: [phone]  Fax:  [phone] Name: RASPEN DETERDINGMRN: [id number] of Birth: [birth year]

## 2017-05-05 ENCOUNTER — Ambulatory Visit: Payer: PPO | Admitting: Physical Therapy

## 2017-05-08 ENCOUNTER — Ambulatory Visit (INDEPENDENT_AMBULATORY_CARE_PROVIDER_SITE_OTHER): Payer: PPO | Admitting: Sports Medicine

## 2017-05-08 ENCOUNTER — Encounter: Payer: Self-pay | Admitting: Sports Medicine

## 2017-05-08 VITALS — BP 192/82 | HR 68 | Wt 190.0 lb

## 2017-05-08 DIAGNOSIS — M7061 Trochanteric bursitis, right hip: Secondary | ICD-10-CM | POA: Diagnosis not present

## 2017-05-08 DIAGNOSIS — Z23 Encounter for immunization: Secondary | ICD-10-CM | POA: Diagnosis not present

## 2017-05-08 NOTE — Assessment & Plan Note (Addendum)
Resolved after injection at the last visit. Return as needed. He will follow up with his PCP urgently regarding his blood pressure.

## 2017-05-08 NOTE — Progress Notes (Signed)
  Subjective:    CC: Follow-up  HPI: Right trochanteric bursitis: Pain resolved after injection, strength is improved considerably as well.  They did have some questions about his elevated blood pressure, he is currently establishing with another PCP. No headaches, visual changes, chest pain.  Past medical history:  Negative.  See flowsheet/record as well for more information.  Surgical history: Negative.  See flowsheet/record as well for more information.  Family history: Negative.  See flowsheet/record as well for more information.  Social history: Negative.  See flowsheet/record as well for more information.  Allergies, and medications have been entered into the medical record, reviewed, and no changes needed.   Review of Systems: No fevers, chills, night sweats, weight loss, chest pain, or shortness of breath.   Objective:    General: Well Developed, well nourished, and in no acute distress.  Neuro: Alert and oriented x3, extra-ocular muscles intact, sensation grossly intact.  HEENT: Normocephalic, atraumatic, pupils equal round reactive to light, neck supple, no masses, no lymphadenopathy, thyroid nonpalpable.  Skin: Warm and dry, no rashes. Cardiac: Regular rate and rhythm, no murmurs rubs or gallops, no lower extremity edema.  Respiratory: Clear to auscultation bilaterally. Not using accessory muscles, speaking in full sentences. Right Hip: ROM IR: 60 Deg, ER: 60 Deg, Flexion: 120 Deg, Extension: 100 Deg, Abduction: 45 Deg, Adduction: 45 Deg Strength IR: 5/5, ER: 5/5, Flexion: 5/5, Extension: 5/5, Abduction: 5/5, Adduction: 5/5 Pelvic alignment unremarkable to inspection and palpation. Standing hip rotation and gait without trendelenburg / unsteadiness. Greater trochanter without tenderness to palpation. No tenderness over piriformis. No SI joint tenderness and normal minimal SI movement. Hip abductor strength greatly improved bilaterally.  Impression and Recommendations:     Trochanteric bursitis of right hip Resolved after injection at the last visit. Return as needed. He will follow up with his PCP urgently regarding his blood pressure.  ___________________________________________ Gwen Her. Dianah Field, M.D., ABFM., CAQSM. Primary Care and [place] Instructor of [place] of [facility] of Medicine

## 2017-05-09 ENCOUNTER — Ambulatory Visit: Payer: PPO

## 2017-05-09 DIAGNOSIS — I10 Essential (primary) hypertension: Secondary | ICD-10-CM | POA: Diagnosis not present

## 2017-05-09 DIAGNOSIS — Z23 Encounter for immunization: Secondary | ICD-10-CM | POA: Diagnosis not present

## 2017-05-09 DIAGNOSIS — E1142 Type 2 diabetes mellitus with diabetic polyneuropathy: Secondary | ICD-10-CM | POA: Diagnosis not present

## 2017-05-09 DIAGNOSIS — E785 Hyperlipidemia, unspecified: Secondary | ICD-10-CM | POA: Diagnosis not present

## 2017-05-12 ENCOUNTER — Ambulatory Visit: Payer: PPO

## 2017-05-16 DIAGNOSIS — I10 Essential (primary) hypertension: Secondary | ICD-10-CM | POA: Diagnosis not present

## 2017-05-16 DIAGNOSIS — E1142 Type 2 diabetes mellitus with diabetic polyneuropathy: Secondary | ICD-10-CM | POA: Diagnosis not present

## 2017-05-18 DIAGNOSIS — E876 Hypokalemia: Secondary | ICD-10-CM | POA: Diagnosis not present

## 2017-06-08 DIAGNOSIS — Z0000 Encounter for general adult medical examination without abnormal findings: Secondary | ICD-10-CM | POA: Diagnosis not present

## 2017-06-08 DIAGNOSIS — Z13820 Encounter for screening for osteoporosis: Secondary | ICD-10-CM | POA: Diagnosis not present

## 2017-06-08 DIAGNOSIS — Z23 Encounter for immunization: Secondary | ICD-10-CM | POA: Diagnosis not present

## 2017-06-08 DIAGNOSIS — E1142 Type 2 diabetes mellitus with diabetic polyneuropathy: Secondary | ICD-10-CM | POA: Diagnosis not present

## 2017-06-08 DIAGNOSIS — I10 Essential (primary) hypertension: Secondary | ICD-10-CM | POA: Diagnosis not present

## 2017-06-08 DIAGNOSIS — H6121 Impacted cerumen, right ear: Secondary | ICD-10-CM | POA: Diagnosis not present

## 2017-06-08 DIAGNOSIS — H9192 Unspecified hearing loss, left ear: Secondary | ICD-10-CM | POA: Diagnosis not present

## 2017-10-10 ENCOUNTER — Ambulatory Visit (INDEPENDENT_AMBULATORY_CARE_PROVIDER_SITE_OTHER): Payer: PPO | Admitting: Sports Medicine

## 2017-10-10 ENCOUNTER — Encounter: Payer: Self-pay | Admitting: Sports Medicine

## 2017-10-10 DIAGNOSIS — Q761 Klippel-Feil syndrome: Secondary | ICD-10-CM

## 2017-10-10 DIAGNOSIS — M7061 Trochanteric bursitis, right hip: Secondary | ICD-10-CM | POA: Diagnosis not present

## 2017-10-10 NOTE — Progress Notes (Signed)
Subjective:    I'm seeing this patient as a consultation for: Dr. Yong Channel  CC: Right hip pain  HPI: This is a pleasant 72 year old male, initially I noted an abnormal gait, on further questioning this gait has been present and stable for decades.  He is however having some pain in his right hip, lateral aspect, moderate, persistent without radiation.  Weakness to right hip abduction, pain when lying on the ipsilateral side.  This is been present for several months.  I reviewed the past medical history, family history, social history, surgical history, and allergies today and no changes were needed.  Please see the problem list section below in epic for further details.  Past Medical History: Past Medical History:  Diagnosis Date  . Cancer [facility]) 1999   Prostate   Past Surgical History: Past Surgical History:  Procedure Laterality Date  . PROSTATE SURGERY     Social History: Social History   Socioeconomic History  . Marital status: Married    Spouse name: Not on file  . Number of children: Not on file  . Years of education: Not on file  . Highest education level: Not on file  Social Needs  . Financial resource strain: Not on file  . Food insecurity - worry: Not on file  . Food insecurity - inability: Not on file  . Transportation needs - medical: Not on file  . Transportation needs - non-medical: Not on file  Occupational History  . Not on file  Tobacco Use  . Smoking status: Former Research scientist (life sciences)  . Smokeless tobacco: Never Used  Substance and Sexual Activity  . Alcohol use: No    Alcohol/week: 0.0 oz  . Drug use: No  . Sexual activity: Not on file  Other Topics Concern  . Not on file  Social History Narrative  . Not on file   Family History: No family history on file. Allergies: Allergies  Allergen Reactions  . Adhesive [Tape] Rash    Latex   Medications: See med rec.  Review of Systems: No headache, visual changes, nausea, vomiting, diarrhea,  constipation, dizziness, abdominal pain, skin rash, fevers, chills, night sweats, weight loss, swollen lymph nodes, body aches, joint swelling, muscle aches, chest pain, shortness of breath, mood changes, visual or auditory hallucinations.   Objective:   General: Well Developed, well nourished, and in no acute distress.  Neuro:  Extra-ocular muscles intact, able to move all 4 extremities, sensation grossly intact.  Deep tendon reflexes tested were normal. Psych: Alert and oriented, mood congruent with affect. ENT:  Ears and nose appear unremarkable.  Hearing grossly normal. Neck: Unremarkable overall appearance, trachea midline.  No visible thyroid enlargement. Eyes: Conjunctivae and lids appear unremarkable.  Pupils equal and round. Skin: Warm and dry, no rashes noted.  Cardiovascular: Pulses palpable, no extremity edema. Right hip: ROM IR: 60 Deg, ER: 60 Deg, Flexion: 120 Deg, Extension: 100 Deg, Abduction: 45 Deg, Adduction: 45 Deg Strength IR: 5/5, ER: 5/5, Flexion: 5/5, Extension: 5/5, Abduction: 4-/5, Adduction: 5/5 Pelvic alignment unremarkable to inspection and palpation. Standing hip rotation and gait without trendelenburg / unsteadiness. Greater trochanter with tenderness to palpation. No tenderness over piriformis. No SI joint tenderness and normal minimal SI movement.  Procedure: Real-time Ultrasound Guided Injection of right greater trochanteric bursa Device: GE Logiq E  Verbal informed consent obtained.  Time-out conducted.  Noted no overlying erythema, induration, or other signs of local infection.  Skin prepped in a sterile fashion.  Local anesthesia: Topical Ethyl chloride.  With sterile technique and under real time ultrasound guidance: Using a 22-gauge spinal needle advanced into the bursa just deep to the hip abductor insertion and injected 1 cc Kenalog 40, 2 cc lidocaine, 2 cc bupivacaine. Completed without difficulty  Pain immediately resolved suggesting accurate  placement of the medication.  Advised to call if fevers/chills, erythema, induration, drainage, or persistent bleeding.  Images permanently stored and available for review in the ultrasound unit.  Impression: Technically successful ultrasound guided injection.  Impression and Recommendations:   This case required medical decision making of moderate complexity.  Trochanteric bursitis of right hip Injection. Aggressive hip abductor rehabilitation. Return in 1 month.  Status post C4-C5 fusion for spinal cord compression Distant C4-C5 fusion for spinal cord compression, it sounds though his gait abnormality is old and chronic as well as unchanged after prostate surgery decades ago. None of his symptoms are new, no further intervention or workup needed.  ___________________________________________ Gwen Her. Dianah Field, M.D., ABFM., CAQSM. Primary Care and [place] Instructor of [place] of [facility] of Medicine

## 2017-10-10 NOTE — Assessment & Plan Note (Signed)
Distant C4-C5 fusion for spinal cord compression, it sounds though his gait abnormality is old and chronic as well as unchanged after prostate surgery decades ago. None of his symptoms are new, no further intervention or workup needed.

## 2017-10-10 NOTE — Assessment & Plan Note (Addendum)
Injection. Aggressive hip abductor rehabilitation. Return in 1 month.

## 2017-11-21 ENCOUNTER — Ambulatory Visit: Payer: PPO | Admitting: Sports Medicine

## 2018-06-11 DIAGNOSIS — E1142 Type 2 diabetes mellitus with diabetic polyneuropathy: Secondary | ICD-10-CM | POA: Diagnosis not present

## 2018-06-11 DIAGNOSIS — Z8546 Personal history of malignant neoplasm of prostate: Secondary | ICD-10-CM | POA: Diagnosis not present

## 2018-06-11 DIAGNOSIS — Z0000 Encounter for general adult medical examination without abnormal findings: Secondary | ICD-10-CM | POA: Diagnosis not present

## 2018-06-11 DIAGNOSIS — Z125 Encounter for screening for malignant neoplasm of prostate: Secondary | ICD-10-CM | POA: Diagnosis not present

## 2018-06-11 DIAGNOSIS — E785 Hyperlipidemia, unspecified: Secondary | ICD-10-CM | POA: Diagnosis not present

## 2018-06-11 DIAGNOSIS — Z23 Encounter for immunization: Secondary | ICD-10-CM | POA: Diagnosis not present

## 2018-06-11 DIAGNOSIS — I10 Essential (primary) hypertension: Secondary | ICD-10-CM | POA: Diagnosis not present

## 2018-09-26 ENCOUNTER — Ambulatory Visit: Payer: PPO | Admitting: Sports Medicine

## 2018-09-26 ENCOUNTER — Ambulatory Visit (INDEPENDENT_AMBULATORY_CARE_PROVIDER_SITE_OTHER): Payer: PPO

## 2018-09-26 ENCOUNTER — Encounter: Payer: Self-pay | Admitting: Sports Medicine

## 2018-09-26 DIAGNOSIS — M545 Low back pain: Secondary | ICD-10-CM

## 2018-09-26 DIAGNOSIS — M5417 Radiculopathy, lumbosacral region: Secondary | ICD-10-CM | POA: Insufficient documentation

## 2018-09-26 DIAGNOSIS — M5136 Other intervertebral disc degeneration, lumbar region: Secondary | ICD-10-CM

## 2018-09-26 DIAGNOSIS — G8929 Other chronic pain: Secondary | ICD-10-CM

## 2018-09-26 DIAGNOSIS — M51369 Other intervertebral disc degeneration, lumbar region without mention of lumbar back pain or lower extremity pain: Secondary | ICD-10-CM

## 2018-09-26 MED ORDER — METHYLPREDNISOLONE SODIUM SUCC 125 MG IJ SOLR
125.0000 mg | Freq: Once | INTRAMUSCULAR | Status: AC
  Administered 2018-09-26: 125 mg via INTRAMUSCULAR

## 2018-09-26 MED ORDER — PREDNISONE 50 MG PO TABS: ORAL_TABLET | ORAL | 0 refills | Status: DC

## 2018-09-26 MED ORDER — KETOROLAC TROMETHAMINE 30 MG/ML IJ SOLN
30.0000 mg | Freq: Once | INTRAMUSCULAR | Status: AC
  Administered 2018-09-26: 30 mg via INTRAMUSCULAR

## 2018-09-26 NOTE — Assessment & Plan Note (Signed)
Severe axial back pain, discogenic without radiculopathy. No red flags. X-rays, 5 days of prednisone. Toradol and Solu-Medrol today. Formal physical therapy. Return to see me in a month.

## 2018-09-26 NOTE — Addendum Note (Signed)
Addended by: Jamesetta So on: 09/26/2018 09:34 AM   Modules accepted: Orders

## 2018-09-26 NOTE — Progress Notes (Signed)
Subjective:    CC: Severe low back pain  HPI: This is a pleasant 73 year old male, this weekend he was trying to lift a heavy bag of wet leaves out of his gutters.  He felt a pull in his back.  After that he had severe pain, worse with sitting, flexion, Valsalva, localized in the back without radiation down the legs, no bowel or bladder dysfunction, no saddle numbness, no constitutional symptoms, no changes in weakness, he does have baseline right leg weakness.  I reviewed the past medical history, family history, social history, surgical history, and allergies today and no changes were needed.  Please see the problem list section below in epic for further details.  Past Medical History: Past Medical History:  Diagnosis Date  . Cancer [facility]) 1999   Prostate   Past Surgical History: Past Surgical History:  Procedure Laterality Date  . PROSTATE SURGERY     Social History: Social History   Socioeconomic History  . Marital status: Married    Spouse name: Not on file  . Number of children: Not on file  . Years of education: Not on file  . Highest education level: Not on file  Occupational History  . Not on file  Social Needs  . Financial resource strain: Not on file  . Food insecurity:    Worry: Not on file    Inability: Not on file  . Transportation needs:    Medical: Not on file    Non-medical: Not on file  Tobacco Use  . Smoking status: Former Research scientist (life sciences)  . Smokeless tobacco: Never Used  Substance and Sexual Activity  . Alcohol use: No    Alcohol/week: 0.0 standard drinks  . Drug use: No  . Sexual activity: Not on file  Lifestyle  . Physical activity:    Days per week: Not on file    Minutes per session: Not on file  . Stress: Not on file  Relationships  . Social connections:    Talks on phone: Not on file    Gets together: Not on file    Attends religious service: Not on file    Active member of club or organization: Not on file    Attends meetings of clubs or  organizations: Not on file    Relationship status: Not on file  Other Topics Concern  . Not on file  Social History Narrative  . Not on file   Family History: No family history on file. Allergies: Allergies  Allergen Reactions  . Adhesive [Tape] Rash    Latex   Medications: See med rec.  Review of Systems: No fevers, chills, night sweats, weight loss, chest pain, or shortness of breath.   Objective:    General: Well Developed, well nourished, and in no acute distress.  Neuro: Alert and oriented x3, extra-ocular muscles intact, sensation grossly intact.  HEENT: Normocephalic, atraumatic, pupils equal round reactive to light, neck supple, no masses, no lymphadenopathy, thyroid nonpalpable.  Skin: Warm and dry, no rashes. Cardiac: Regular rate and rhythm, no murmurs rubs or gallops, no lower extremity edema.  Respiratory: Clear to auscultation bilaterally. Not using accessory muscles, speaking in full sentences. Back: Inspection: Unremarkable  Motion: Flexion 45 deg, Extension 45 deg, Side Bending to 45 deg bilaterally,  Rotation to 45 deg bilaterally  SLR laying: Negative  XSLR laying: Negative  Palpable tenderness: None. FABER: negative. Sensory change: Gross sensation intact to all lumbar and sacral dermatomes.  Reflexes: 2+ at both patellar tendons, 2+ at achilles tendons,  Babinski's downgoing.  Strength at foot  Plantar-flexion: 5/5 Dorsi-flexion: 5/5 Eversion: 5/5 Inversion: 5/5  Leg strength  Quad: 5/5 Hamstring: 4/5 on the right, 5/5 on the left, hip flexor: 5/5 Hip abductors: 5/5  Gait unremarkable.  Impression and Recommendations:    Lumbar degenerative disc disease Severe axial back pain, discogenic without radiculopathy. No red flags. X-rays, 5 days of prednisone. Toradol and Solu-Medrol today. Formal physical therapy. Return to see me in a month. ___________________________________________ Gwen Her. Dianah Field, M.D., ABFM., CAQSM. Primary Care and  Sports Medicine [facility]  Adjunct Professor of [place] of [facility] of Medicine

## 2018-09-27 ENCOUNTER — Telehealth: Payer: Self-pay | Admitting: *Deleted

## 2018-09-27 NOTE — Telephone Encounter (Signed)
Pt's daughter left vm wanting to know exactly where his disc herniation is and what his limitations are.  She stated that he is the sole caretaker of his wife who is having back surgery today and depending on his limitations they may need to make other arrangements.  Please advise.

## 2018-09-27 NOTE — Telephone Encounter (Signed)
I do not know, I have not gotten an MRI.  It is in his lumbar spine, but does it really matter what level it is at?  That is entirely not important unless we get to the point where we are going to discuss intervention such as an epidural injection.

## 2018-09-27 NOTE — Telephone Encounter (Signed)
What are the physical limitations, if any?

## 2018-09-27 NOTE — Telephone Encounter (Signed)
Activities as tolerated, avoid deep squats and heavy lifting.  If something hurts then back off.

## 2018-09-28 NOTE — Telephone Encounter (Signed)
Pt's daughter's vm is full and I'm unable to leave her a message.

## 2018-10-29 ENCOUNTER — Encounter: Payer: Self-pay | Admitting: Sports Medicine

## 2018-10-29 ENCOUNTER — Ambulatory Visit (INDEPENDENT_AMBULATORY_CARE_PROVIDER_SITE_OTHER): Payer: PPO | Admitting: Sports Medicine

## 2018-10-29 DIAGNOSIS — M51369 Other intervertebral disc degeneration, lumbar region without mention of lumbar back pain or lower extremity pain: Secondary | ICD-10-CM

## 2018-10-29 DIAGNOSIS — M5136 Other intervertebral disc degeneration, lumbar region: Secondary | ICD-10-CM | POA: Diagnosis not present

## 2018-10-29 NOTE — Progress Notes (Signed)
  Subjective:    CC: Back pain  HPI: Now resolved.  I reviewed the past medical history, family history, social history, surgical history, and allergies today and no changes were needed.  Please see the problem list section below in epic for further details.  Past Medical History: Past Medical History:  Diagnosis Date  . Cancer [facility]) 1999   Prostate   Past Surgical History: Past Surgical History:  Procedure Laterality Date  . PROSTATE SURGERY     Social History: Social History   Socioeconomic History  . Marital status: Married    Spouse name: Not on file  . Number of children: Not on file  . Years of education: Not on file  . Highest education level: Not on file  Occupational History  . Not on file  Social Needs  . Financial resource strain: Not on file  . Food insecurity:    Worry: Not on file    Inability: Not on file  . Transportation needs:    Medical: Not on file    Non-medical: Not on file  Tobacco Use  . Smoking status: Former Research scientist (life sciences)  . Smokeless tobacco: Never Used  Substance and Sexual Activity  . Alcohol use: No    Alcohol/week: 0.0 standard drinks  . Drug use: No  . Sexual activity: Not on file  Lifestyle  . Physical activity:    Days per week: Not on file    Minutes per session: Not on file  . Stress: Not on file  Relationships  . Social connections:    Talks on phone: Not on file    Gets together: Not on file    Attends religious service: Not on file    Active member of club or organization: Not on file    Attends meetings of clubs or organizations: Not on file    Relationship status: Not on file  Other Topics Concern  . Not on file  Social History Narrative  . Not on file   Family History: No family history on file. Allergies: Allergies  Allergen Reactions  . Adhesive [Tape] Rash    Latex   Medications: See med rec.  Review of Systems: No fevers, chills, night sweats, weight loss, chest pain, or shortness of breath.    Objective:    General: Well Developed, well nourished, and in no acute distress.  Neuro: Alert and oriented x3, extra-ocular muscles intact, sensation grossly intact.  HEENT: Normocephalic, atraumatic, pupils equal round reactive to light, neck supple, no masses, no lymphadenopathy, thyroid nonpalpable.  Skin: Warm and dry, no rashes. Cardiac: Regular rate and rhythm, no murmurs rubs or gallops, no lower extremity edema.  Respiratory: Clear to auscultation bilaterally. Not using accessory muscles, speaking in full sentences.  Impression and Recommendations:    Lumbar degenerative disc disease Severe axial discogenic back pain without radiculopathy, now resolved with Toradol, Solu-Medrol, prednisone. Rehab exercises given, he never did formal PT. Return as needed. ___________________________________________ Gwen Her. Dianah Field, M.D., ABFM., CAQSM. Primary Care and Sports Medicine [facility]  Adjunct Professor of [place] of [facility] of Medicine

## 2018-10-29 NOTE — Assessment & Plan Note (Signed)
Severe axial discogenic back pain without radiculopathy, now resolved with Toradol, Solu-Medrol, prednisone. Rehab exercises given, he never did formal PT. Return as needed.

## 2019-02-21 DIAGNOSIS — M19071 Primary osteoarthritis, right ankle and foot: Secondary | ICD-10-CM | POA: Diagnosis not present

## 2019-02-21 DIAGNOSIS — M722 Plantar fascial fibromatosis: Secondary | ICD-10-CM | POA: Diagnosis not present

## 2019-02-21 DIAGNOSIS — M76821 Posterior tibial tendinitis, right leg: Secondary | ICD-10-CM | POA: Diagnosis not present

## 2019-03-12 DIAGNOSIS — I10 Essential (primary) hypertension: Secondary | ICD-10-CM | POA: Diagnosis not present

## 2019-03-12 DIAGNOSIS — Z1159 Encounter for screening for other viral diseases: Secondary | ICD-10-CM | POA: Diagnosis not present

## 2019-03-12 DIAGNOSIS — E785 Hyperlipidemia, unspecified: Secondary | ICD-10-CM | POA: Diagnosis not present

## 2019-03-12 DIAGNOSIS — Z23 Encounter for immunization: Secondary | ICD-10-CM | POA: Diagnosis not present

## 2019-03-12 DIAGNOSIS — E1142 Type 2 diabetes mellitus with diabetic polyneuropathy: Secondary | ICD-10-CM | POA: Diagnosis not present

## 2019-03-12 DIAGNOSIS — R269 Unspecified abnormalities of gait and mobility: Secondary | ICD-10-CM | POA: Diagnosis not present

## 2019-03-20 DIAGNOSIS — R269 Unspecified abnormalities of gait and mobility: Secondary | ICD-10-CM | POA: Diagnosis not present

## 2019-03-20 DIAGNOSIS — R29898 Other symptoms and signs involving the musculoskeletal system: Secondary | ICD-10-CM | POA: Diagnosis not present

## 2019-03-20 DIAGNOSIS — R2689 Other abnormalities of gait and mobility: Secondary | ICD-10-CM | POA: Diagnosis not present

## 2019-03-23 ENCOUNTER — Encounter (HOSPITAL_BASED_OUTPATIENT_CLINIC_OR_DEPARTMENT_OTHER): Payer: Self-pay | Admitting: Emergency Medicine

## 2019-03-23 ENCOUNTER — Emergency Department (HOSPITAL_BASED_OUTPATIENT_CLINIC_OR_DEPARTMENT_OTHER): Payer: PPO

## 2019-03-23 ENCOUNTER — Other Ambulatory Visit: Payer: Self-pay

## 2019-03-23 ENCOUNTER — Emergency Department (HOSPITAL_BASED_OUTPATIENT_CLINIC_OR_DEPARTMENT_OTHER)
Admission: EM | Admit: 2019-03-23 | Discharge: 2019-03-23 | Disposition: A | Discharge status: Home / Self Care | Payer: PPO | Attending: Emergency Medicine | Admitting: Emergency Medicine

## 2019-03-23 DIAGNOSIS — Z794 Long term (current) use of insulin: Secondary | ICD-10-CM | POA: Insufficient documentation

## 2019-03-23 DIAGNOSIS — Z79899 Other long term (current) drug therapy: Secondary | ICD-10-CM | POA: Insufficient documentation

## 2019-03-23 DIAGNOSIS — Z87891 Personal history of nicotine dependence: Secondary | ICD-10-CM | POA: Diagnosis not present

## 2019-03-23 DIAGNOSIS — M5416 Radiculopathy, lumbar region: Secondary | ICD-10-CM

## 2019-03-23 DIAGNOSIS — I10 Essential (primary) hypertension: Secondary | ICD-10-CM | POA: Diagnosis not present

## 2019-03-23 DIAGNOSIS — M5431 Sciatica, right side: Secondary | ICD-10-CM

## 2019-03-23 DIAGNOSIS — Z7982 Long term (current) use of aspirin: Secondary | ICD-10-CM | POA: Diagnosis not present

## 2019-03-23 DIAGNOSIS — E119 Type 2 diabetes mellitus without complications: Secondary | ICD-10-CM | POA: Diagnosis not present

## 2019-03-23 DIAGNOSIS — M545 Low back pain: Secondary | ICD-10-CM | POA: Diagnosis not present

## 2019-03-23 DIAGNOSIS — M25551 Pain in right hip: Secondary | ICD-10-CM | POA: Diagnosis present

## 2019-03-23 DIAGNOSIS — M160 Bilateral primary osteoarthritis of hip: Secondary | ICD-10-CM | POA: Diagnosis not present

## 2019-03-23 HISTORY — DX: Type 2 diabetes mellitus without complications: E11.9

## 2019-03-23 HISTORY — DX: Dorsalgia, unspecified: M54.9

## 2019-03-23 HISTORY — DX: Essential (primary) hypertension: I10

## 2019-03-23 MED ORDER — HYDROCODONE-ACETAMINOPHEN 5-325 MG PO TABS: 1.0000 | ORAL_TABLET | Freq: Four times a day (QID) | ORAL | 0 refills | Status: DC | PRN

## 2019-03-23 NOTE — ED Provider Notes (Signed)
[facility] EMERGENCY DEPARTMENT Provider Note   CSN: [id number] Arrival date & time: 03/23/19  6283    History   Chief Complaint Chief Complaint  Patient presents with  . Hip Pain    HPI Anthony Peterson is a 73 y.o. male.     HPI Pt is having pain in his right leg.  Pt states it started about a month ago.  He has been using a cane because when he puts weight on it it feels like his leg is going to give away.  Walking increases the pain.  The pain goes from his hip down to towards his thigh and knee. He has seen an ortho doctor about a year ago and was given a shot but it was not down in his leg before.    No fall.  Past Medical History:  Diagnosis Date  . Back pain    Dx with Bulging Disc this year  . Cancer [facility]) 1999   Prostate  . Diabetes mellitus without complication ([place])   . Hypertension     Patient Active Problem List   Diagnosis Date Noted  . Lumbar degenerative disc disease 09/26/2018  . Status post C4-C5 fusion for spinal cord compression 10/10/2017  . Trochanteric bursitis of right hip 07/14/2015  . Primary osteoarthritis of right knee 07/14/2015    Past Surgical History:  Procedure Laterality Date  . BACK SURGERY    . PROSTATE SURGERY          Home Medications    Prior to Admission medications   Medication Sig Start Date End Date Taking? Authorizing Provider  amLODipine (NORVASC) 10 MG tablet Take 10 mg by mouth daily. 06/01/18  Yes [provider]  aspirin EC 81 MG tablet Take by mouth.   Yes [provider]  atorvastatin (LIPITOR) 20 MG tablet  01/03/15  Yes [provider]  gabapentin (NEURONTIN) 300 MG capsule Take 1/2 tablet in the morning and  Take one tablet before bed 03/12/19  Yes [provider]  losartan-hydrochlorothiazide (HYZAAR) 100-25 MG tablet TK 1 T PO D 09/05/18  Yes [provider]  meloxicam (MOBIC) 15 MG tablet Take 15 mg by mouth daily.   Yes [provider]   potassium chloride (K-DUR) 10 MEQ tablet TAKE 2 TABLETS(20MEQ) BY MOUTH DAILY 07/30/18  Yes [provider]  HYDROcodone-acetaminophen (NORCO/VICODIN) 5-325 MG tablet Take 1 tablet by mouth every 6 (six) hours as needed. 03/23/19   Dorie Rank, MD  insulin aspart protamine - aspart (NOVOLOG MIX 70/30 FLEXPEN) (70-30) 100 UNIT/ML FlexPen  01/03/15   [provider]    Family History No family history on file.  Social History Social History   Tobacco Use  . Smoking status: Former Research scientist (life sciences)  . Smokeless tobacco: Never Used  Substance Use Topics  . Alcohol use: No    Alcohol/week: 0.0 standard drinks  . Drug use: No     Allergies   Adhesive [tape]   Review of Systems Review of Systems  All other systems reviewed and are negative.    Physical Exam Updated Vital Signs BP 138/86 (BP Location: Right Arm)   Pulse 99   Temp 98.8 F (37.1 C) (Oral)   Resp 18   Ht 1.803 m (5\' 11" )   Wt 80.7 kg   SpO2 100%   BMI 24.83 kg/m   Physical Exam Vitals signs and nursing note reviewed.  Constitutional:      General: He is not in acute distress.  Appearance: He is well-developed.  HENT:     Head: Normocephalic and atraumatic.     Right Ear: External ear normal.     Left Ear: External ear normal.  Eyes:     General: No scleral icterus.       Right eye: No discharge.        Left eye: No discharge.     Conjunctiva/sclera: Conjunctivae normal.  Neck:     Musculoskeletal: Neck supple.     Trachea: No tracheal deviation.  Cardiovascular:     Rate and Rhythm: Normal rate.  Pulmonary:     Effort: Pulmonary effort is normal. No respiratory distress.     Breath sounds: No stridor.  Abdominal:     General: There is no distension.  Musculoskeletal:        General: No swelling or deformity.     Right hip: Normal.     Lumbar back: Normal. He exhibits no bony tenderness, no swelling, no edema and no deformity.     Comments: Muscular atrophy of the right lower  extremity, right foot drop noted on exam, weakness right lower extremity  Skin:    General: Skin is warm and dry.     Findings: No rash.  Neurological:     Mental Status: He is alert.     Cranial Nerves: Cranial nerve deficit: no gross deficits.     Motor: Weakness and atrophy present.     Comments: right foot drop noted on exam, weakness right lower extremity       ED Treatments / Results  Labs (all labs ordered are listed, but only abnormal results are displayed) Labs Reviewed - No data to display  EKG None  Radiology Dg Lumbar Spine Complete  Result Date: 03/23/2019 CLINICAL DATA:  73 year old male with a history of lumbar back pain EXAM: LUMBAR SPINE - COMPLETE 4+ VIEW COMPARISON:  September 26, 2018 FINDINGS: Lumbar Spine: Lumbar vertebral elements maintain normal alignment without evidence of anterolisthesis, retrolisthesis, subluxation. No acute fracture line identified. Vertebral body heights maintained. Disc space narrowing most pronounced at L5-S1. Facet hypertrophy most pronounced at L4-L5 and L5-S1. Oblique images demonstrate no displaced pars defect. Surgical changes of the pelvis. Unremarkable appearance of the visualized abdomen. IMPRESSION: Negative for acute fracture or malalignment of the lumbar spine. Mild disc changes of the lower lumbar spine with associated facet disease Electronically Signed   By: Corrie Mckusick D.O.   On: 03/23/2019 10:14   Dg Hip Unilat W Or Wo Pelvis 2-3 Views Right  Result Date: 03/23/2019 CLINICAL DATA:  73 year old male with a history right hip pain EXAM: DG HIP (WITH OR WITHOUT PELVIS) 2-3V RIGHT COMPARISON:  07/14/2015 FINDINGS: Bony pelvic ring intact. No acute displaced fracture. Bilateral hips projects normally over the acetabula. Joint space narrowing bilaterally. Surgical changes of the bilateral pelvic sidewall as well as penile implant. Unremarkable proximal right femur. IMPRESSION: Negative for acute bony abnormality. Bilateral hip  osteoarthritis. Electronically Signed   By: Corrie Mckusick D.O.   On: 03/23/2019 10:13    Procedures Procedures (including critical care time)  Medications Ordered in ED Medications - No data to display   Initial Impression / Assessment and Plan / ED Course  I have reviewed the triage vital signs and the nursing notes.  Pertinent labs & imaging results that were available during my care of the patient were reviewed by me and considered in my medical decision making (see chart for details).   Patient presented to the emergency room  with complaints of pain in his right hip.  On exam the patient had notable atrophy of his right lower extremity.  He also appears to have a foot drop.  X-rays were done in the ED and I do not see any significant abnormalities in his spine.  He has mild arthritis in his hip but I doubt this is the source of his pain.  I reviewed the medical records in epic.  Patient has been seeing [facility] physical therapy in [place].  Apparently he has been seeing them for gait instability and weakness of his right lower extremity.  There is a note from yesterday asking his doctor to refer him to orthopedic spine.  I agree with this assessment.  I believe the patient's symptoms are related to a lumbar radiculopathy.  The atrophy and foot drop suggests a more chronic impingement.  I did speak with the patient's family members the daughter asked about getting an MRI.  I agree that this would be necessary but unfortunate something I can do here at this freestanding emergency room.  Patient already appears to have appropriate outpatient referral to have this done.  Plan on discharge home with prescription for hydrocodone to help him with his pain.  Patient states he took 1 tablet from his wife last night and that helped significantly. Final Clinical Impressions(s) / ED Diagnoses   Final diagnoses:  Sciatica, right side  Lumbar radiculopathy    ED Discharge Orders          Ordered    HYDROcodone-acetaminophen (NORCO/VICODIN) 5-325 MG tablet  Every 6 hours PRN     03/23/19 1047           Dorie Rank, MD 03/23/19 1053

## 2019-03-23 NOTE — ED Notes (Signed)
ED Provider at bedside. 

## 2019-03-23 NOTE — Discharge Instructions (Signed)
I reviewed the records from [facility].  Your physical therapist does recommend your doctor refer you to an orthopedic spine specialist as you indicated.  Please follow-up with them for further testing and evaluation.

## 2019-03-23 NOTE — ED Triage Notes (Signed)
Fort last 2 weeks has been having right hip pain with wt bearing, MD has ordered PT for this but is unavailable . Denies recent injury

## 2019-03-25 ENCOUNTER — Encounter: Payer: Self-pay | Admitting: Sports Medicine

## 2019-03-25 DIAGNOSIS — M5136 Other intervertebral disc degeneration, lumbar region: Secondary | ICD-10-CM

## 2019-03-25 DIAGNOSIS — M21371 Foot drop, right foot: Secondary | ICD-10-CM

## 2019-03-31 ENCOUNTER — Other Ambulatory Visit: Payer: Self-pay

## 2019-03-31 ENCOUNTER — Ambulatory Visit (INDEPENDENT_AMBULATORY_CARE_PROVIDER_SITE_OTHER): Payer: PPO

## 2019-03-31 DIAGNOSIS — M5136 Other intervertebral disc degeneration, lumbar region: Secondary | ICD-10-CM | POA: Diagnosis not present

## 2019-03-31 DIAGNOSIS — M5126 Other intervertebral disc displacement, lumbar region: Secondary | ICD-10-CM | POA: Diagnosis not present

## 2019-03-31 DIAGNOSIS — R531 Weakness: Secondary | ICD-10-CM | POA: Diagnosis not present

## 2019-03-31 DIAGNOSIS — R29810 Facial weakness: Secondary | ICD-10-CM | POA: Diagnosis not present

## 2019-03-31 DIAGNOSIS — M21371 Foot drop, right foot: Secondary | ICD-10-CM

## 2019-03-31 DIAGNOSIS — M47816 Spondylosis without myelopathy or radiculopathy, lumbar region: Secondary | ICD-10-CM | POA: Diagnosis not present

## 2019-04-03 ENCOUNTER — Other Ambulatory Visit: Payer: Self-pay

## 2019-04-03 ENCOUNTER — Encounter: Payer: Self-pay | Admitting: Sports Medicine

## 2019-04-03 ENCOUNTER — Ambulatory Visit (INDEPENDENT_AMBULATORY_CARE_PROVIDER_SITE_OTHER): Payer: PPO | Admitting: Sports Medicine

## 2019-04-03 DIAGNOSIS — M48061 Spinal stenosis, lumbar region without neurogenic claudication: Secondary | ICD-10-CM | POA: Diagnosis not present

## 2019-04-03 DIAGNOSIS — M5417 Radiculopathy, lumbosacral region: Secondary | ICD-10-CM

## 2019-04-03 DIAGNOSIS — M4726 Other spondylosis with radiculopathy, lumbar region: Secondary | ICD-10-CM | POA: Diagnosis not present

## 2019-04-03 DIAGNOSIS — M5136 Other intervertebral disc degeneration, lumbar region: Secondary | ICD-10-CM | POA: Diagnosis not present

## 2019-04-03 DIAGNOSIS — M545 Low back pain: Secondary | ICD-10-CM | POA: Diagnosis not present

## 2019-04-03 MED ORDER — KETOROLAC TROMETHAMINE 30 MG/ML IJ SOLN
30.0000 mg | Freq: Once | INTRAMUSCULAR | Status: AC
  Administered 2019-04-03: 30 mg via INTRAMUSCULAR

## 2019-04-03 MED ORDER — METHYLPREDNISOLONE SODIUM SUCC 125 MG IJ SOLR
125.0000 mg | Freq: Once | INTRAMUSCULAR | Status: AC
  Administered 2019-04-03: 125 mg via INTRAMUSCULAR

## 2019-04-03 NOTE — Assessment & Plan Note (Signed)
History of baseline right foot drop, now with new onset right sided radiculopathy with weakness, L5 distribution, MRI shows compression of the extraforaminal right L5 nerve root. Toradol 30, Solu-Medrol 125 for acute pain relief. Urgent referral to Dr. Lynann Bologna considering weakness. Urgent referral to Dr. Francesco Runner for a right L5-S1 transforaminal epidural.

## 2019-04-03 NOTE — Progress Notes (Signed)
Subjective:    CC: Back pain with radiation down the leg  HPI: For the past several weeks this pleasant 73 year old male with a history of chronic right L4 radiculopathy with foot drop has had a worsening of axial back pain, no trauma, no constitutional symptoms, it radiates down his right leg, to the dorsolateral right lower leg, as well as the top and the bottom of the foot sparing the great toe and the small toe.  He is also noted significant and worsening weakness with a limp.  I reviewed the past medical history, family history, social history, surgical history, and allergies today and no changes were needed.  Please see the problem list section below in epic for further details.  Past Medical History: Past Medical History:  Diagnosis Date  . Back pain    Dx with Bulging Disc this year  . Cancer [facility]) 1999   Prostate  . Diabetes mellitus without complication ([place])   . Hypertension    Past Surgical History: Past Surgical History:  Procedure Laterality Date  . BACK SURGERY    . PROSTATE SURGERY     Social History: Social History   Socioeconomic History  . Marital status: Married    Spouse name: Not on file  . Number of children: Not on file  . Years of education: Not on file  . Highest education level: Not on file  Occupational History  . Not on file  Social Needs  . Financial resource strain: Not on file  . Food insecurity    Worry: Not on file    Inability: Not on file  . Transportation needs    Medical: Not on file    Non-medical: Not on file  Tobacco Use  . Smoking status: Former Research scientist (life sciences)  . Smokeless tobacco: Never Used  Substance and Sexual Activity  . Alcohol use: No    Alcohol/week: 0.0 standard drinks  . Drug use: No  . Sexual activity: Not on file  Lifestyle  . Physical activity    Days per week: Not on file    Minutes per session: Not on file  . Stress: Not on file  Relationships  . Social Herbalist on phone: Not on file    Gets  together: Not on file    Attends religious service: Not on file    Active member of club or organization: Not on file    Attends meetings of clubs or organizations: Not on file    Relationship status: Not on file  Other Topics Concern  . Not on file  Social History Narrative  . Not on file   Family History: No family history on file. Allergies: Allergies  Allergen Reactions  . Adhesive [Tape] Rash    Latex   Medications: See med rec.  Review of Systems: No fevers, chills, night sweats, weight loss, chest pain, or shortness of breath.   Objective:    General: Well Developed, well nourished, and in no acute distress.  Neuro: Alert and oriented x3, extra-ocular muscles intact, sensation grossly intact.  HEENT: Normocephalic, atraumatic, pupils equal round reactive to light, neck supple, no masses, no lymphadenopathy, thyroid nonpalpable.  Skin: Warm and dry, no rashes. Cardiac: Regular rate and rhythm, no murmurs rubs or gallops, no lower extremity edema.  Respiratory: Clear to auscultation bilaterally. Not using accessory muscles, speaking in full sentences. Back Exam:  Inspection: Unremarkable  Motion: Flexion 45 deg, Extension 45 deg, Side Bending to 45 deg bilaterally,  Rotation to 45  deg bilaterally  SLR laying: Negative  XSLR laying: Negative  Palpable tenderness: None. FABER: negative. Sensory change: Gross sensation intact to all lumbar and sacral dermatomes.  Reflexes: 2+ at both patellar tendons, 2+ at achilles tendons, Babinski's downgoing.  Strength at foot  Plantar-flexion: 3+/5 Dorsi-flexion: 3-/5 Eversion: 5/5 Inversion: 5/5  Leg strength  Quad: 5/5 Hamstring: 4-/5 Hip flexor: 5/5 Hip abductors: 5/5  Gait unremarkable.  MRI shows clear extraforaminal compression of the right L5 nerve root  Impression and Recommendations:    Right lumbosacral radiculopathy at L5 History of baseline right foot drop, now with new onset right sided radiculopathy with  weakness, L5 distribution, MRI shows compression of the extraforaminal right L5 nerve root. Toradol 30, Solu-Medrol 125 for acute pain relief. Urgent referral to Dr. Lynann Bologna considering weakness. Urgent referral to Dr. Francesco Runner for a right L5-S1 transforaminal epidural.   ___________________________________________ Gwen Her. Dianah Field, M.D., ABFM., CAQSM. Primary Care and Sports Medicine [facility]  Adjunct Professor of [employer] [facility] [employer] [facility] [employer]

## 2019-04-04 ENCOUNTER — Encounter: Payer: Self-pay | Admitting: Sports Medicine

## 2019-04-04 MED ORDER — AMBULATORY NON FORMULARY MEDICATION: 0 refills | Status: AC

## 2019-04-05 DIAGNOSIS — M21371 Foot drop, right foot: Secondary | ICD-10-CM | POA: Diagnosis not present

## 2019-04-05 DIAGNOSIS — M79604 Pain in right leg: Secondary | ICD-10-CM | POA: Diagnosis not present

## 2019-04-11 ENCOUNTER — Ambulatory Visit (INDEPENDENT_AMBULATORY_CARE_PROVIDER_SITE_OTHER): Payer: PPO | Admitting: Diagnostic Neuroimaging

## 2019-04-11 ENCOUNTER — Encounter: Payer: PPO | Admitting: Diagnostic Neuroimaging

## 2019-04-11 ENCOUNTER — Other Ambulatory Visit: Payer: Self-pay

## 2019-04-11 DIAGNOSIS — R29898 Other symptoms and signs involving the musculoskeletal system: Secondary | ICD-10-CM

## 2019-04-11 DIAGNOSIS — Z0289 Encounter for other administrative examinations: Secondary | ICD-10-CM

## 2019-04-11 NOTE — Procedures (Signed)
[facility]  NCS (NERVE CONDUCTION STUDY) WITH EMG (ELECTROMYOGRAPHY) REPORT   STUDY DATE: 04/11/19 PATIENT NAME: Anthony Peterson DOB: 1946-02-27 MRN: [id number]  ORDERING CLINICIAN: Collene Gobble  TECHNOLOGIST: Sherre Scarlet ELECTROMYOGRAPHER: Earlean Polka. Penumalli, MD  CLINICAL INFORMATION: 73 year old male with right leg numbness, pain, weakness since 1999, with worsening right foot drop past 6 months.  History of multiple prostate surgeries.  Exam notable for weakness of right gluteus medius, right knee flexion, right foot dorsiflexion, right foot plantar flexion, right foot inversion and right foot eversion.   FINDINGS: NERVE CONDUCTION STUDY: Right peroneal motor response has normal distal latency, decreased amplitude, slow conduction velocity.  Right tibial motor response has normal distal latency, decreased amplitude with proximal stimulation, slow conduction velocity.  Left peroneal and tibial motor responses are normal.  Right sural sensory responses normal.  Right superficial peroneal sensory response cannot be obtained.  Left sural sensory response is prolonged peak latency and normal amplitude.  Left superficial peroneal sensory response has prolonged peak latency and decreased amplitude.  Bilateral tibial and right peroneal F wave latencies are prolonged.  Left peroneal F wave latency is normal.   NEEDLE ELECTROMYOGRAPHY:  Needle examination of right lower extremity notable for abnormal spontaneous activity and decreased recruitment of small motor units in right gluteus medius, right gastrocnemius, right tibialis posterior, right tibialis anterior muscles.  Right lumbar paraspinal muscles are normal.   IMPRESSION:   This abnormal study demonstrating: - Electrodiagnostic evidence of right lumbosacral plexopathy, mainly affecting L5 and S1 innervated muscles.  Given history of pelvic/prostate surgeries, consider clinical correlation and imaging of the  pelvis.    INTERPRETING PHYSICIAN:  Penni Bombard, MD Certified in Neurology, Neurophysiology and Neuroimaging  [facility] [address] [place], [place] [address] [phone]   [facility]    Nerve / Sites Muscle Latency Ref. Amplitude Ref. Rel Amp Segments Distance Velocity Ref. Area    ms ms mV mV %  cm m/s m/s mVms  R Peroneal - EDB     Ankle EDB 5.4 ?6.5 1.2 ?2.0 100 Ankle - EDB 9   4.1     Fib head EDB 15.0  0.8  64.5 Fib head - Ankle 33 34 ?44 4.1     Pop fossa EDB 18.3  1.3  161 Pop fossa - Fib head 10 30 ?44 6.8         Pop fossa - Ankle      L Peroneal - EDB     Ankle EDB 5.5 ?6.5 5.8 ?2.0 100 Ankle - EDB 9   16.8     Fib head EDB 13.1  5.6  97 Fib head - Ankle 33 44 ?44 17.1     Pop fossa EDB 15.4  5.6  99.5 Pop fossa - Fib head 10 44 ?44 16.7         Pop fossa - Ankle      R Tibial - AH     Ankle AH 3.9 ?5.8 4.0 ?4.0 100 Ankle - AH 9   9.4     Pop fossa AH 15.7  1.6  39.7 Pop fossa - Ankle 42 35 ?41 6.3  L Tibial - AH     Ankle AH 3.8 ?5.8 7.9 ?4.0 100 Ankle - AH 9   17.4     Pop fossa AH 14.1  5.9  74.9 Pop fossa - Ankle 42 41 ?41 12.4  [place]    Nerve / Sites Rec. Site Peak Lat Ref.  Amp Ref. Segments Distance    ms ms V V  cm  R Sural - Ankle (Calf)     Calf Ankle 3.9 ?4.4 7 ?6 Calf - Ankle 14  L Sural - Ankle (Calf)     Calf Ankle 4.6 ?4.4 6 ?6 Calf - Ankle 14  R Superficial peroneal - Ankle     Lat leg Ankle NR ?4.4 NR ?6 Lat leg - Ankle 14  L Superficial peroneal - Ankle     Lat leg Ankle 4.7 ?4.4 4 ?6 Lat leg - Ankle 14              F  Wave    Nerve F Lat Ref.   ms ms  R Peroneal - EDB 70.7 ?56.0  R Tibial - AH 63.8 ?56.0  L Peroneal - EDB 55.7 ?56.0  L Tibial - AH 62.3 ?56.0             EMG full       EMG Summary Table    Spontaneous MUAP Recruitment  Muscle IA Fib PSW Fasc Other Amp Dur. Poly Pattern  R. Vastus medialis Normal None None None _______ Normal Normal Normal Normal  R. Tibialis  anterior Normal 2+ 2+ None _______ Decreased Normal Normal Reduced  R. Tibialis posterior Normal 1+ 1+ None _______ Decreased Normal Normal Reduced  R. Gastrocnemius (Medial head) Normal 1+ 1+ None _______ Decreased Normal Normal Reduced  R. Biceps femoris (short head) Normal None None None _______ Normal Normal Normal Normal  R. Biceps femoris (long head) Normal None None None _______ Normal Normal Normal Normal  R. Iliopsoas Normal None None None _______ Normal Normal Normal Normal  R. Gluteus medius Normal 1+ 1+ None _______ Decreased Normal Normal Reduced  R. Lumbar paraspinals Normal None None None _______ Normal Normal Normal Normal

## 2019-05-01 ENCOUNTER — Other Ambulatory Visit: Payer: Self-pay

## 2019-05-01 ENCOUNTER — Ambulatory Visit (INDEPENDENT_AMBULATORY_CARE_PROVIDER_SITE_OTHER): Payer: PPO | Admitting: Sports Medicine

## 2019-05-01 ENCOUNTER — Encounter: Payer: Self-pay | Admitting: Sports Medicine

## 2019-05-01 DIAGNOSIS — M7061 Trochanteric bursitis, right hip: Secondary | ICD-10-CM

## 2019-05-01 DIAGNOSIS — M5417 Radiculopathy, lumbosacral region: Secondary | ICD-10-CM

## 2019-05-01 NOTE — Progress Notes (Signed)
Subjective:    CC: Follow-up  HPI: Lumbar radiculitis: Improved with epidural.  Trochanter bursitis: Pain of the right lateral hip, difficulty laying on the side, we did an injection several years ago with good relief.  I reviewed the past medical history, family history, social history, surgical history, and allergies today and no changes were needed.  Please see the problem list section below in epic for further details.  Past Medical History: Past Medical History:  Diagnosis Date  . Back pain    Dx with Bulging Disc this year  . Cancer [facility]) 1999   Prostate  . Diabetes mellitus without complication ([place])   . Hypertension    Past Surgical History: Past Surgical History:  Procedure Laterality Date  . BACK SURGERY    . PROSTATE SURGERY     Social History: Social History   Socioeconomic History  . Marital status: Married    Spouse name: Not on file  . Number of children: Not on file  . Years of education: Not on file  . Highest education level: Not on file  Occupational History  . Not on file  Social Needs  . Financial resource strain: Not on file  . Food insecurity    Worry: Not on file    Inability: Not on file  . Transportation needs    Medical: Not on file    Non-medical: Not on file  Tobacco Use  . Smoking status: Former Research scientist (life sciences)  . Smokeless tobacco: Never Used  Substance and Sexual Activity  . Alcohol use: No    Alcohol/week: 0.0 standard drinks  . Drug use: No  . Sexual activity: Not on file  Lifestyle  . Physical activity    Days per week: Not on file    Minutes per session: Not on file  . Stress: Not on file  Relationships  . Social Herbalist on phone: Not on file    Gets together: Not on file    Attends religious service: Not on file    Active member of club or organization: Not on file    Attends meetings of clubs or organizations: Not on file    Relationship status: Not on file  Other Topics Concern  . Not on file  Social  History Narrative  . Not on file   Family History: No family history on file. Allergies: Allergies  Allergen Reactions  . Adhesive [Tape] Rash    Latex   Medications: See med rec.  Review of Systems: No fevers, chills, night sweats, weight loss, chest pain, or shortness of breath.   Objective:    General: Well Developed, well nourished, and in no acute distress.  Neuro: Alert and oriented x3, extra-ocular muscles intact, sensation grossly intact.  HEENT: Normocephalic, atraumatic, pupils equal round reactive to light, neck supple, no masses, no lymphadenopathy, thyroid nonpalpable.  Skin: Warm and dry, no rashes. Cardiac: Regular rate and rhythm, no murmurs rubs or gallops, no lower extremity edema.  Respiratory: Clear to auscultation bilaterally. Not using accessory muscles, speaking in full sentences. Right hip: ROM IR: 60 Deg, ER: 60 Deg, Flexion: 120 Deg, Extension: 100 Deg, Abduction: 45 Deg, Adduction: 45 Deg Strength IR: 5/5, ER: 5/5, Flexion: 5/5, Extension: 5/5, Abduction: 5/5, Adduction: 5/5 Pelvic alignment unremarkable to inspection and palpation. Standing hip rotation and gait without trendelenburg / unsteadiness. Greater trochanter with severe tenderness to palpation. No tenderness over piriformis. No SI joint tenderness and normal minimal SI movement.  Procedure: Real-time Ultrasound Guided injection  of the right greater trochanteric bursa Device: GE Logiq E  Verbal informed consent obtained.  Time-out conducted.  Noted no overlying erythema, induration, or other signs of local infection.  Skin prepped in a sterile fashion.  Local anesthesia: Topical Ethyl chloride.  With sterile technique and under real time ultrasound guidance:  1 cc Kenalog 40, 2 cc lidocaine, 2 cc bupivacaine injected easily Completed without difficulty  Pain immediately resolved suggesting accurate placement of the medication.  Advised to call if fevers/chills, erythema, induration,  drainage, or persistent bleeding.  Images permanently stored and available for review in the ultrasound unit.  Impression: Technically successful ultrasound guided injection.  Impression and Recommendations:    Trochanteric bursitis of right hip Last injected in February 2019. Now with recurrence of symptoms, injected today. Return in 1 month.  Right lumbosacral radiculopathy at L5 History of baseline foot drop, he does have a carbon fiber AFO. Dr. Lynann Bologna does not think there is a surgical option for this. His radicular symptoms are gone after a right L5-S1 transforaminal epidural with Dr. Francesco Runner. There was a concern of lumbosacral plexus injury after pelvic surgery, we can address this in the future with a pelvic MRI with contrast if recurrence of radicular symptoms.    ___________________________________________ Gwen Her. Dianah Field, M.D., ABFM., CAQSM. Primary Care and Sports Medicine [facility]  Adjunct Professor of [employer] [facility] [employer] [facility] of Medicine

## 2019-05-01 NOTE — Assessment & Plan Note (Addendum)
History of baseline foot drop, he does have a carbon fiber AFO. Dr. Lynann Bologna does not think there is a surgical option for this. His radicular symptoms are gone after a right L5-S1 transforaminal epidural with Dr. Francesco Runner. There was a concern of lumbosacral plexus injury after pelvic surgery, we can address this in the future with a pelvic MRI with contrast if recurrence of radicular symptoms.

## 2019-05-01 NOTE — Assessment & Plan Note (Signed)
Last injected in February 2019. Now with recurrence of symptoms, injected today. Return in 1 month.

## 2019-05-10 DIAGNOSIS — M5416 Radiculopathy, lumbar region: Secondary | ICD-10-CM | POA: Diagnosis not present

## 2019-05-14 DIAGNOSIS — I10 Essential (primary) hypertension: Secondary | ICD-10-CM | POA: Diagnosis not present

## 2019-05-14 DIAGNOSIS — E876 Hypokalemia: Secondary | ICD-10-CM | POA: Diagnosis not present

## 2019-05-14 DIAGNOSIS — E1142 Type 2 diabetes mellitus with diabetic polyneuropathy: Secondary | ICD-10-CM | POA: Diagnosis not present

## 2019-05-14 DIAGNOSIS — D649 Anemia, unspecified: Secondary | ICD-10-CM | POA: Diagnosis not present

## 2019-05-14 DIAGNOSIS — E785 Hyperlipidemia, unspecified: Secondary | ICD-10-CM | POA: Diagnosis not present

## 2019-05-29 ENCOUNTER — Encounter: Payer: Self-pay | Admitting: Sports Medicine

## 2019-05-29 ENCOUNTER — Other Ambulatory Visit: Payer: Self-pay

## 2019-05-29 ENCOUNTER — Ambulatory Visit (INDEPENDENT_AMBULATORY_CARE_PROVIDER_SITE_OTHER): Payer: PPO | Admitting: Sports Medicine

## 2019-05-29 DIAGNOSIS — M7061 Trochanteric bursitis, right hip: Secondary | ICD-10-CM | POA: Diagnosis not present

## 2019-05-29 DIAGNOSIS — M5417 Radiculopathy, lumbosacral region: Secondary | ICD-10-CM

## 2019-05-29 NOTE — Assessment & Plan Note (Signed)
History of baseline foot drop with a carbon fiber AFO, nothing surgical with Dr. Lynann Bologna. Symptoms traditionally improved with right L5-S1 transforaminal epidurals, he does have some discomfort at night, increasing gabapentin to 300 mg in the morning and 600 at night. Return as needed for this.  Of note there was some concern for lumbosacral plexus injury after pelvic surgery, this will remain on the back burner, I think this is less likely.

## 2019-05-29 NOTE — Progress Notes (Signed)
Subjective:    CC: Follow-up  HPI: This is a pleasant 73 year old male, he has done well with his trochanteric bursa injection at the last visit, he still has some right-sided lumbosacral radicular symptoms, only doing gabapentin 300 twice a day right now.  I reviewed the past medical history, family history, social history, surgical history, and allergies today and no changes were needed.  Please see the problem list section below in epic for further details.  Past Medical History: Past Medical History:  Diagnosis Date  . Back pain    Dx with Bulging Disc this year  . Cancer [facility]) 1999   Prostate  . Diabetes mellitus without complication ([place])   . Hypertension    Past Surgical History: Past Surgical History:  Procedure Laterality Date  . BACK SURGERY    . PROSTATE SURGERY     Social History: Social History   Socioeconomic History  . Marital status: Married    Spouse name: Not on file  . Number of children: Not on file  . Years of education: Not on file  . Highest education level: Not on file  Occupational History  . Not on file  Social Needs  . Financial resource strain: Not on file  . Food insecurity    Worry: Not on file    Inability: Not on file  . Transportation needs    Medical: Not on file    Non-medical: Not on file  Tobacco Use  . Smoking status: Former Research scientist (life sciences)  . Smokeless tobacco: Never Used  Substance and Sexual Activity  . Alcohol use: No    Alcohol/week: 0.0 standard drinks  . Drug use: No  . Sexual activity: Not on file  Lifestyle  . Physical activity    Days per week: Not on file    Minutes per session: Not on file  . Stress: Not on file  Relationships  . Social Herbalist on phone: Not on file    Gets together: Not on file    Attends religious service: Not on file    Active member of club or organization: Not on file    Attends meetings of clubs or organizations: Not on file    Relationship status: Not on file  Other Topics  Concern  . Not on file  Social History Narrative  . Not on file   Family History: No family history on file. Allergies: Allergies  Allergen Reactions  . Adhesive [Tape] Rash    Latex   Medications: See med rec.  Review of Systems: No fevers, chills, night sweats, weight loss, chest pain, or shortness of breath.   Objective:    General: Well Developed, well nourished, and in no acute distress.  Neuro: Alert and oriented x3, extra-ocular muscles intact, sensation grossly intact.  HEENT: Normocephalic, atraumatic, pupils equal round reactive to light, neck supple, no masses, no lymphadenopathy, thyroid nonpalpable.  Skin: Warm and dry, no rashes. Cardiac: Regular rate and rhythm, no murmurs rubs or gallops, no lower extremity edema.  Respiratory: Clear to auscultation bilaterally. Not using accessory muscles, speaking in full sentences.  Impression and Recommendations:    Trochanteric bursitis of right hip Resolved after injection at the last visit.  Right lumbosacral radiculopathy at L5 History of baseline foot drop with a carbon fiber AFO, nothing surgical with Dr. Lynann Bologna. Symptoms traditionally improved with right L5-S1 transforaminal epidurals, he does have some discomfort at night, increasing gabapentin to 300 mg in the morning and 600 at night. Return as  needed for this.  Of note there was some concern for lumbosacral plexus injury after pelvic surgery, this will remain on the back burner, I think this is less likely.   ___________________________________________ Gwen Her. Dianah Field, M.D., ABFM., CAQSM. Primary Care and Sports Medicine [facility]  Adjunct Professor of [place] [employer]

## 2019-05-29 NOTE — Assessment & Plan Note (Signed)
Resolved after injection at the last visit 

## 2020-02-11 DIAGNOSIS — R55 Syncope and collapse: Secondary | ICD-10-CM | POA: Diagnosis not present

## 2020-02-11 DIAGNOSIS — I499 Cardiac arrhythmia, unspecified: Secondary | ICD-10-CM | POA: Diagnosis not present

## 2020-02-11 DIAGNOSIS — R1084 Generalized abdominal pain: Secondary | ICD-10-CM | POA: Diagnosis not present

## 2020-02-11 DIAGNOSIS — I491 Atrial premature depolarization: Secondary | ICD-10-CM | POA: Diagnosis not present

## 2020-02-11 DIAGNOSIS — R52 Pain, unspecified: Secondary | ICD-10-CM | POA: Diagnosis not present

## 2020-02-14 DIAGNOSIS — E785 Hyperlipidemia, unspecified: Secondary | ICD-10-CM | POA: Diagnosis not present

## 2020-02-14 DIAGNOSIS — I10 Essential (primary) hypertension: Secondary | ICD-10-CM | POA: Diagnosis not present

## 2020-02-14 DIAGNOSIS — R9431 Abnormal electrocardiogram [ECG] [EKG]: Secondary | ICD-10-CM | POA: Diagnosis not present

## 2020-02-14 DIAGNOSIS — E1142 Type 2 diabetes mellitus with diabetic polyneuropathy: Secondary | ICD-10-CM | POA: Diagnosis not present

## 2020-02-26 DIAGNOSIS — Z7982 Long term (current) use of aspirin: Secondary | ICD-10-CM | POA: Diagnosis not present

## 2020-02-26 DIAGNOSIS — R9431 Abnormal electrocardiogram [ECG] [EKG]: Secondary | ICD-10-CM | POA: Diagnosis not present

## 2020-02-26 DIAGNOSIS — Z87891 Personal history of nicotine dependence: Secondary | ICD-10-CM | POA: Diagnosis not present

## 2020-02-26 DIAGNOSIS — E782 Mixed hyperlipidemia: Secondary | ICD-10-CM | POA: Diagnosis not present

## 2020-02-26 DIAGNOSIS — I10 Essential (primary) hypertension: Secondary | ICD-10-CM | POA: Diagnosis not present

## 2020-03-23 DIAGNOSIS — R9431 Abnormal electrocardiogram [ECG] [EKG]: Secondary | ICD-10-CM | POA: Diagnosis not present

## 2020-04-09 DIAGNOSIS — E1142 Type 2 diabetes mellitus with diabetic polyneuropathy: Secondary | ICD-10-CM | POA: Diagnosis not present

## 2020-04-09 DIAGNOSIS — R9431 Abnormal electrocardiogram [ECG] [EKG]: Secondary | ICD-10-CM | POA: Diagnosis not present

## 2020-04-09 DIAGNOSIS — I10 Essential (primary) hypertension: Secondary | ICD-10-CM | POA: Diagnosis not present

## 2020-04-29 DIAGNOSIS — Z7982 Long term (current) use of aspirin: Secondary | ICD-10-CM | POA: Diagnosis not present

## 2020-04-29 DIAGNOSIS — R9431 Abnormal electrocardiogram [ECG] [EKG]: Secondary | ICD-10-CM | POA: Diagnosis not present

## 2020-04-29 DIAGNOSIS — E782 Mixed hyperlipidemia: Secondary | ICD-10-CM | POA: Diagnosis not present

## 2020-04-29 DIAGNOSIS — I10 Essential (primary) hypertension: Secondary | ICD-10-CM | POA: Diagnosis not present

## 2020-04-29 DIAGNOSIS — Z87891 Personal history of nicotine dependence: Secondary | ICD-10-CM | POA: Diagnosis not present

## 2020-05-29 IMAGING — MR MRI LUMBAR SPINE WITHOUT CONTRAST
4 of 5 series · 26 of 48 positions shown · non-contrast
Comparison: Plain films 03/23/2019.

CLINICAL DATA: RIGHT hip pain. RIGHT leg pain and weakness. RIGHT
foot drop.

EXAM:
MRI LUMBAR SPINE WITHOUT CONTRAST
TECHNIQUE: Multiplanar, multisequence MR imaging of the lumbar spine was
performed. No intravenous contrast was administered.

[Series 4: T2 · sagittal · 4.0mm · 0.81mm/px · 5 of 15 slices shown (1 of 2)]
[im 1/15]
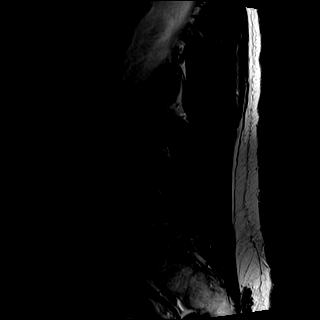
[im 4/15]
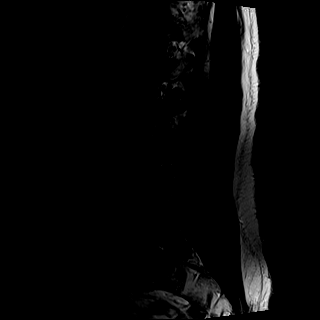
[im 8/15]
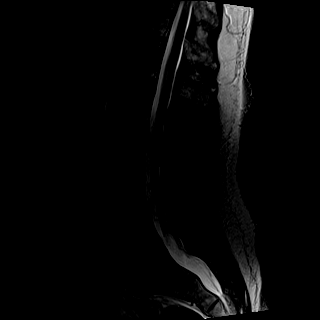
[im 11/15]
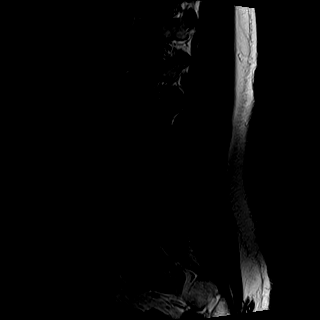
[im 15/15]
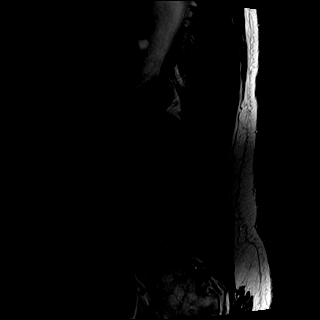

[Series 5: T1 · sagittal · 4.0mm · 0.41mm/px · 5 of 15 slices shown (1 of 2)]
[im 1/15]
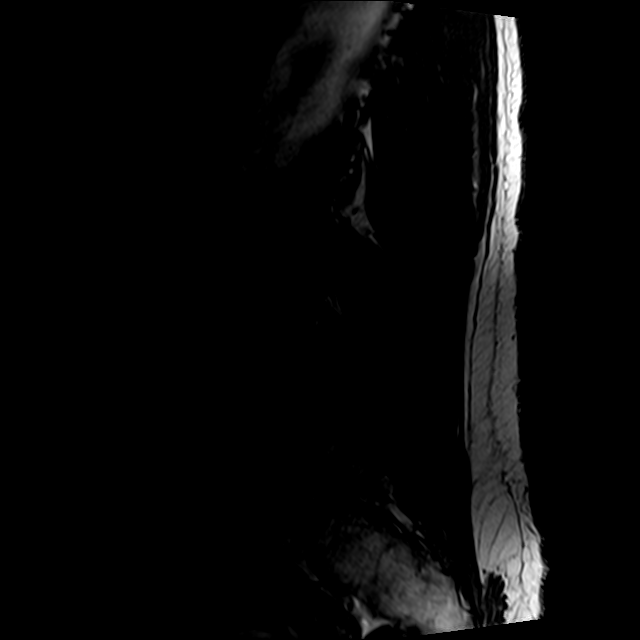
[im 4/15]
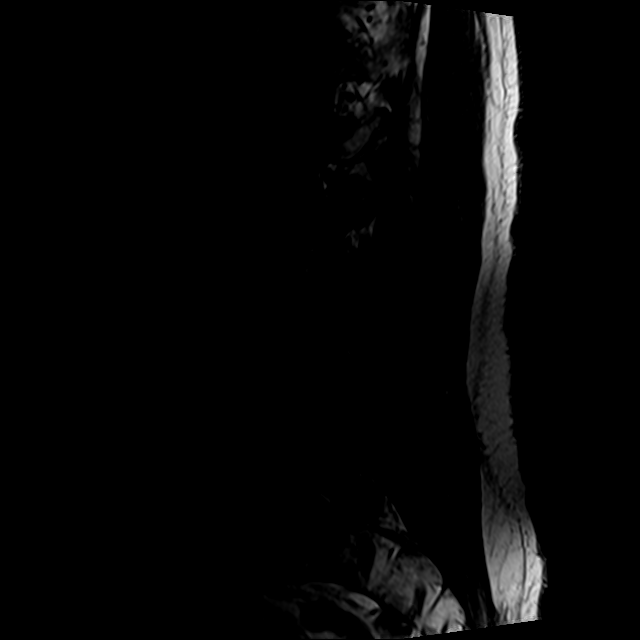
[im 8/15]
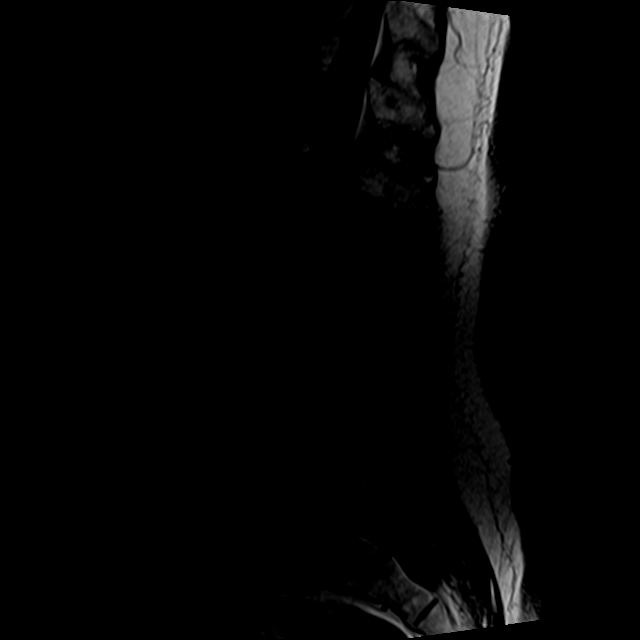
[im 11/15]
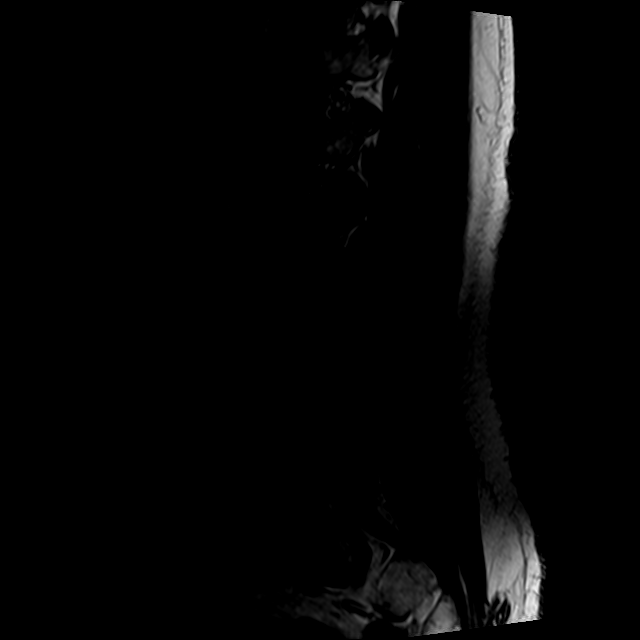
[im 15/15]
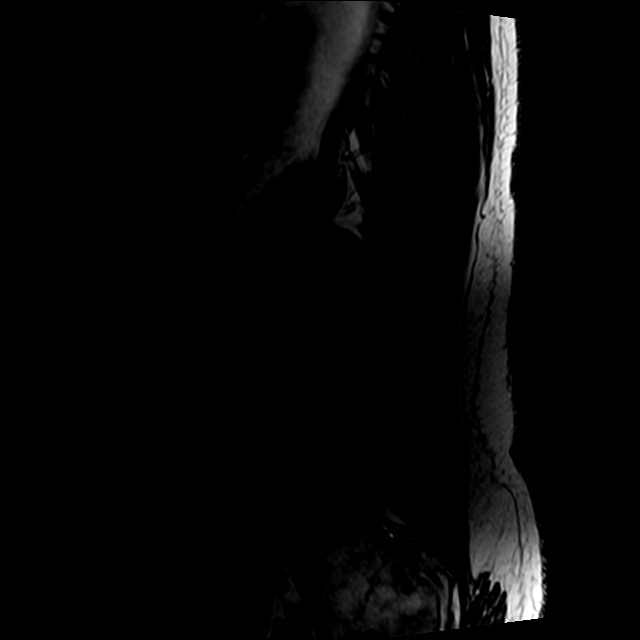

[Series 7: T2 · axial · 4.0mm · 0.78mm/px · z∈[-58,+169]mm · 10 of 42 slices shown (2 of 2)]
[im 3/42]
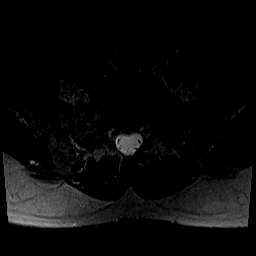
[im 6/42]
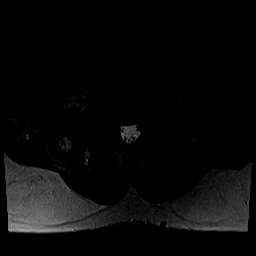
[im 9/42]
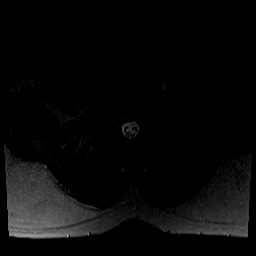
[im 14/42]
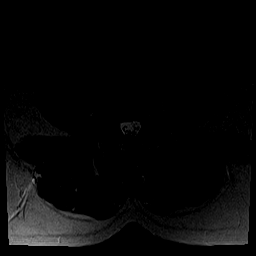
[im 20/42]
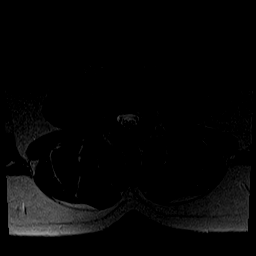
[im 22/42]
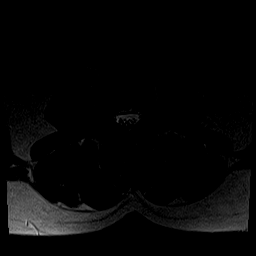
[im 25/42]
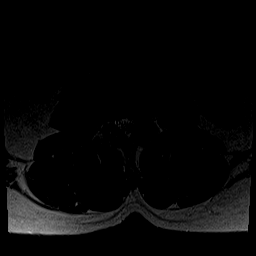
[im 31/42]
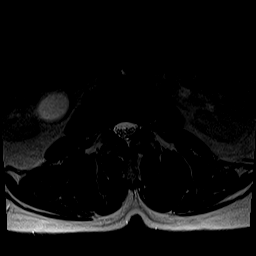
[im 36/42]
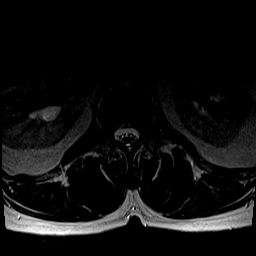
[im 42/42]
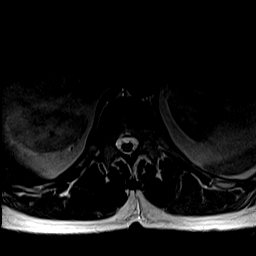

[Series 8: T1 · axial · 4.0mm · 0.39mm/px · z∈[-58,+139]mm · 6 of 42 slices shown (2 of 2)]
[im 3/42]
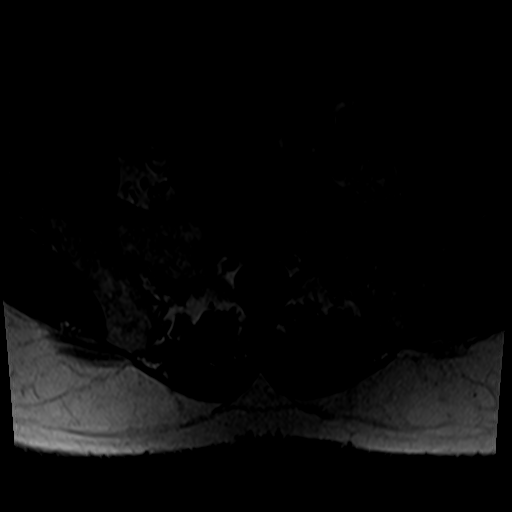
[im 6/42]
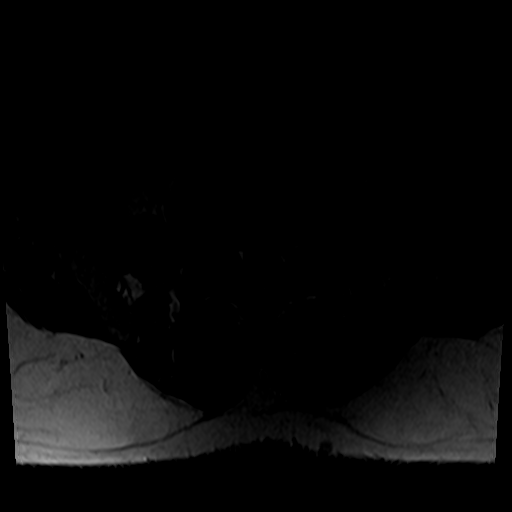
[im 9/42]
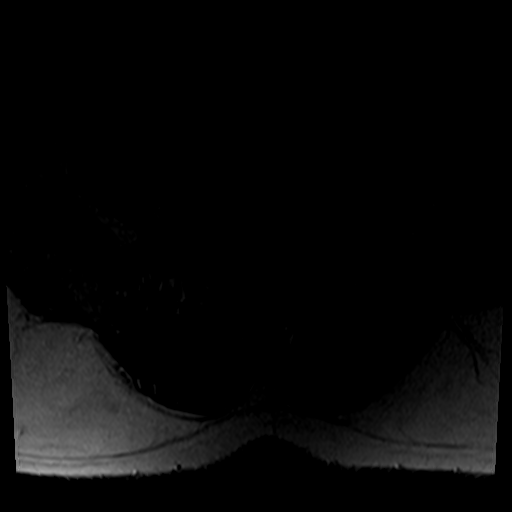
[im 14/42]
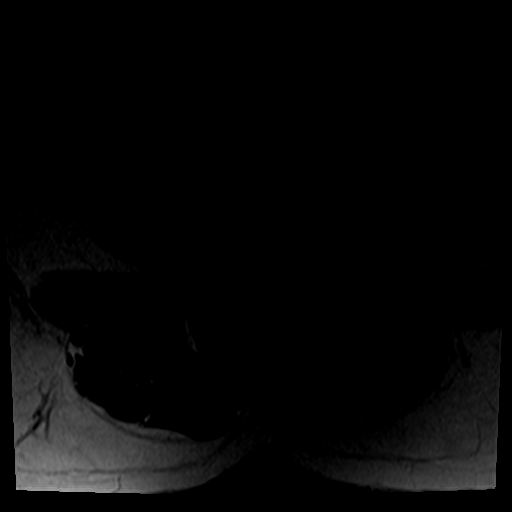
[im 22/42]
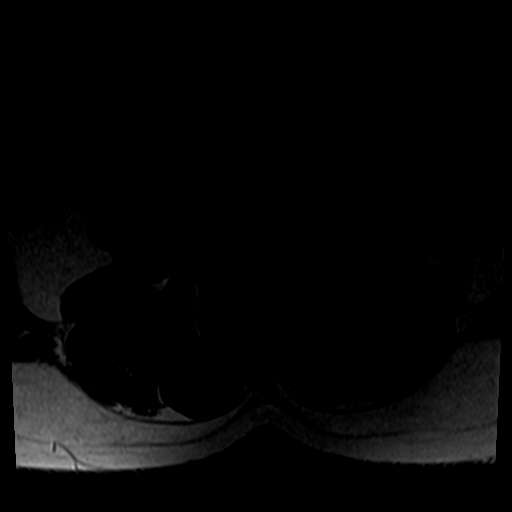
[im 36/42]
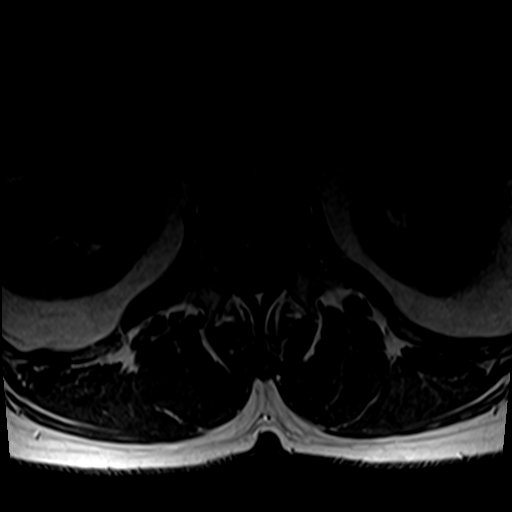

[26 of 48 positions shown; findings below may reference images not displayed]

FINDINGS: Segmentation:  Standard.

Alignment:  Physiologic.

Vertebrae:  No fracture, evidence of discitis, or bone lesion.

Conus medullaris and cauda equina: Conus extends to the L1 level.
Conus and cauda equina appear normal.

Paraspinal and other soft tissues: Unremarkable.

Disc levels:

L1-L2: Slight disc desiccation. Mild facet arthropathy. No
impingement.

L2-L3: Slight disc desiccation. Annular bulge. Shallow foraminal
protrusion to the LEFT. Posterior element hypertrophy. No definite
impingement.

L3-L4: Slight disc desiccation. Annular bulge. Posterior element
hypertrophy. No impingement.

L4-L5: Slight disc desiccation. Annular bulge. Posterior element
hypertrophy. No definite impingement.

L5-S1: Slight disc desiccation. Posterior element hypertrophy.
Foraminal protrusion extends to the RIGHT. See series 4, image 3
series 6, image 3. On axial images, there is a suspected contiguous
extraforaminal disc component to the RIGHT on series 7, image 37.
IMPRESSION: Suspected foraminal and extraforaminal protrusion L5-S1, RIGHT.
Correlate clinically for RIGHT L5 radicular symptoms. Elsewhere mild
lumbar spondylosis.

## 2020-08-05 DIAGNOSIS — E119 Type 2 diabetes mellitus without complications: Secondary | ICD-10-CM | POA: Diagnosis not present

## 2020-08-11 DIAGNOSIS — E1142 Type 2 diabetes mellitus with diabetic polyneuropathy: Secondary | ICD-10-CM | POA: Diagnosis not present

## 2020-08-17 DIAGNOSIS — I10 Essential (primary) hypertension: Secondary | ICD-10-CM | POA: Diagnosis not present

## 2020-08-17 DIAGNOSIS — E785 Hyperlipidemia, unspecified: Secondary | ICD-10-CM | POA: Diagnosis not present

## 2020-08-17 DIAGNOSIS — E1142 Type 2 diabetes mellitus with diabetic polyneuropathy: Secondary | ICD-10-CM | POA: Diagnosis not present

## 2020-08-17 DIAGNOSIS — Z23 Encounter for immunization: Secondary | ICD-10-CM | POA: Diagnosis not present

## 2020-12-23 DIAGNOSIS — I10 Essential (primary) hypertension: Secondary | ICD-10-CM | POA: Diagnosis not present

## 2020-12-23 DIAGNOSIS — E785 Hyperlipidemia, unspecified: Secondary | ICD-10-CM | POA: Diagnosis not present

## 2020-12-23 DIAGNOSIS — E1142 Type 2 diabetes mellitus with diabetic polyneuropathy: Secondary | ICD-10-CM | POA: Diagnosis not present

## 2020-12-24 DIAGNOSIS — E1142 Type 2 diabetes mellitus with diabetic polyneuropathy: Secondary | ICD-10-CM | POA: Diagnosis not present

## 2021-03-24 DIAGNOSIS — I10 Essential (primary) hypertension: Secondary | ICD-10-CM | POA: Diagnosis not present

## 2021-03-24 DIAGNOSIS — R109 Unspecified abdominal pain: Secondary | ICD-10-CM | POA: Diagnosis not present

## 2021-03-24 DIAGNOSIS — E785 Hyperlipidemia, unspecified: Secondary | ICD-10-CM | POA: Diagnosis not present

## 2021-03-24 DIAGNOSIS — E1142 Type 2 diabetes mellitus with diabetic polyneuropathy: Secondary | ICD-10-CM | POA: Diagnosis not present

## 2021-03-24 DIAGNOSIS — D649 Anemia, unspecified: Secondary | ICD-10-CM | POA: Diagnosis not present

## 2021-03-29 DIAGNOSIS — R109 Unspecified abdominal pain: Secondary | ICD-10-CM | POA: Diagnosis not present

## 2021-03-29 DIAGNOSIS — I10 Essential (primary) hypertension: Secondary | ICD-10-CM | POA: Diagnosis not present

## 2021-03-29 DIAGNOSIS — E1142 Type 2 diabetes mellitus with diabetic polyneuropathy: Secondary | ICD-10-CM | POA: Diagnosis not present

## 2021-03-29 DIAGNOSIS — D649 Anemia, unspecified: Secondary | ICD-10-CM | POA: Diagnosis not present

## 2021-03-30 DIAGNOSIS — R1012 Left upper quadrant pain: Secondary | ICD-10-CM | POA: Diagnosis not present

## 2021-03-30 DIAGNOSIS — R109 Unspecified abdominal pain: Secondary | ICD-10-CM | POA: Diagnosis not present

## 2021-04-28 DIAGNOSIS — E785 Hyperlipidemia, unspecified: Secondary | ICD-10-CM | POA: Diagnosis not present

## 2021-04-28 DIAGNOSIS — E876 Hypokalemia: Secondary | ICD-10-CM | POA: Diagnosis not present

## 2021-04-28 DIAGNOSIS — E1142 Type 2 diabetes mellitus with diabetic polyneuropathy: Secondary | ICD-10-CM | POA: Diagnosis not present

## 2021-04-28 DIAGNOSIS — I10 Essential (primary) hypertension: Secondary | ICD-10-CM | POA: Diagnosis not present
